# Patient Record
Sex: Female | Born: 1990 | Hispanic: No | Smoking: Never smoker
Health system: Southern US, Community
[De-identification: ages and names within clinical notes are randomized; demographics above are authoritative.]

## PROBLEM LIST (undated history)

## (undated) DIAGNOSIS — Z789 Other specified health status: Secondary | ICD-10-CM

## (undated) HISTORY — PX: NO PAST SURGERIES: SHX2092

## (undated) HISTORY — DX: Other specified health status: Z78.9

---

## 2020-05-02 NOTE — L&D Delivery Note (Signed)
OB/GYN Faculty Practice Delivery Note  Stacey Knight is a 30 y.o. G2P1001 s/p VAVD at [redacted]w[redacted]d. She was admitted for labor.   ROM: 1h 34m with clear fluid GBS Status:  Negative/-- (09/22 1528)  Labor Progress: Initial SVE: 6 cm. She then progressed to complete. She did not have an epidural.   Delivery Date/Time: 01/29/21 at 1633 Delivery: Called to room as the patient was complete and pushing and FHT were noted to be down. Suture position confirmed. Somewhat tight band noted at perineum but did not feel like it was preventing delivery of the head. Head was at outlet, so vacuum applied. Head delivered ROA. No nuchal cord absent. With delivery of the head, turtling noted. Informed support staff of likely shoulder dystocia. Patient placed in McRobert's position. No fundal pressure applied. Anterior (left) shoulder did not delivery with gentle downward traction. Suprapubic pressure applied and anterior shoulder delivered with gentle downward traction. Total shoulder dystocia time was less than 1 minute.   Body then delivered in usual fashion. Infant with spontaneous cry, placed on mother's abdomen, dried and stimulated. Cord clamped x 2 after 1-minute delay, and cut by Dr. Nobie Putnam. Cord blood drawn. Placenta delivered spontaneously with gentle cord traction and suprapubic pressure. Despite this as placenta delivered, uterus could be felt behind the placenta c/w uterine inversion. Pitocin was already off and the patient was given terbutaline SQ once and while this was given, I place one hand above and one fist below to reduce the uterine inversion. The total time for uterine inversion was approximately 1 minute. Pitocin restarted.   Fundus firm with massage and Pitocin. Labia, perineum, vagina, and cervix inspected  and repaired with 3-0 vicryl. Repair done in the usual fashion. After repair I checked the fundus and LUS once more on bimanual exam and the uterine was firm.  Baby Weight: 3949g  Placenta: Sent  to pathology Complications: Shoulder dystocia, Uterine inversion Lacerations: 2nd degree, right periurethral EBL: 150 mL Analgesia: Local for repair   Infant:  APGAR (1 MIN): 5   APGAR (5 MINS): 9    Following delivery and after recovery, I returned to the patient room and reviewed all aspects of delivery of baby and placenta. Discussed risk of recurrence for shoulder dystocia. Discussed likely related to size of the baby especially given how much larger than her first. She notes first baby was 3.2 kg and that was a challenging delivery. We discussed the uterine inversion. Discussed no long term sequelae from any of these complications given that baby was moving both arms normally without deficit.

## 2020-11-26 ENCOUNTER — Other Ambulatory Visit (HOSPITAL_COMMUNITY)
Admission: RE | Admit: 2020-11-26 | Discharge: 2020-11-26 | Disposition: A | Discharge status: Home / Self Care | Payer: Medicaid Other | Source: Ambulatory Visit | Attending: Obstetrics and Gynecology | Admitting: Obstetrics and Gynecology

## 2020-11-26 ENCOUNTER — Other Ambulatory Visit: Payer: Self-pay

## 2020-11-26 ENCOUNTER — Encounter: Payer: Self-pay | Admitting: Obstetrics and Gynecology

## 2020-11-26 ENCOUNTER — Ambulatory Visit (INDEPENDENT_AMBULATORY_CARE_PROVIDER_SITE_OTHER): Payer: Self-pay | Admitting: Obstetrics and Gynecology

## 2020-11-26 DIAGNOSIS — Z3480 Encounter for supervision of other normal pregnancy, unspecified trimester: Secondary | ICD-10-CM | POA: Insufficient documentation

## 2020-11-26 DIAGNOSIS — Z603 Acculturation difficulty: Secondary | ICD-10-CM

## 2020-11-26 DIAGNOSIS — Z789 Other specified health status: Secondary | ICD-10-CM

## 2020-11-26 DIAGNOSIS — Z3490 Encounter for supervision of normal pregnancy, unspecified, unspecified trimester: Secondary | ICD-10-CM | POA: Insufficient documentation

## 2020-11-26 HISTORY — DX: Acculturation difficulty: Z60.3

## 2020-11-26 NOTE — Patient Instructions (Signed)

## 2020-11-26 NOTE — Progress Notes (Signed)
  Subjective:    Stacey Knight is a G2P1001 [redacted]w[redacted]d being seen today for her first obstetrical visit.  Patient arrived from [place] with some care. Patient reports a normal anatomy ultrasound and denies concerns with hypertension. Her obstetrical history is significant for previous uncomplicated vaginal delivery and language barrier. Patient does intend to breast feed. Pregnancy history fully reviewed.  Patient reports no complaints.  Vitals:   11/26/20 1350 11/26/20 1356  BP: 108/83   Pulse: 100   Weight: 154 lb 8 oz (70.1 kg)   Height:  6\' 1"  (1.854 m)    HISTORY: OB History  Gravida Para Term Preterm AB Living  2 1 1     1   SAB IAB Ectopic Multiple Live Births          1    # Outcome Date GA Lbr Len/2nd Weight Sex Delivery Anes PTL Lv  2 Current           1 Term 01/05/18    M Vag-Spont None N LIV   History reviewed. No pertinent past medical history. History reviewed. No pertinent surgical history. History reviewed. No pertinent family history.   Exam    Uterus:     Pelvic Exam:    Perineum: Normal Perineum   Vulva: normal   Vagina:  normal mucosa, normal discharge   pH:    Cervix: multiparous appearance and cervix is closed and long   Adnexa: not evaluated   Bony Pelvis: gynecoid  System: Breast:  normal appearance, no masses or tenderness   Skin: normal coloration and turgor, no rashes    Neurologic: oriented, no focal deficits   Extremities: normal strength, tone, and muscle mass   HEENT extra ocular movement intact   Mouth/Teeth mucous membranes moist, pharynx normal without lesions and dental hygiene good   Neck supple and no masses   Cardiovascular: regular rate and rhythm   Respiratory:  appears well, vitals normal, no respiratory distress, acyanotic, normal RR, chest clear, no wheezing, crepitations, rhonchi, normal symmetric air entry   Abdomen: soft, non-tender; bowel sounds normal; no masses,  no organomegaly and gravid   Urinary:       Assessment:     Pregnancy: G2P1001 There are no problems to display for this patient.       Plan:     Initial labs drawn. Prenatal vitamins. Problem list reviewed and updated. Genetic Screening discussed : undecided.  Ultrasound discussed; fetal survey: ordered. Patient to return prior to her next appointment for glucola  Follow up in 2 weeks. 50% of 30 min visit spent on counseling and coordination of care.     Corryn Madewell 11/26/2020

## 2020-11-27 LAB — CBC/D/PLT+RPR+RH+ABO+RUBIGG...
Antibody Screen: NEGATIVE
Basophils Absolute: 0.1 10*3/uL (ref 0.0–0.2)
Basos: 1 %
EOS (ABSOLUTE): 0.4 10*3/uL (ref 0.0–0.4)
Eos: 4 %
HCV Ab: 0.1 s/co ratio (ref 0.0–0.9)
HIV Screen 4th Generation wRfx: NONREACTIVE
Hematocrit: 37.8 % (ref 34.0–46.6)
Hemoglobin: 12.7 g/dL (ref 11.1–15.9)
Hepatitis B Surface Ag: NEGATIVE
Immature Grans (Abs): 0 10*3/uL (ref 0.0–0.1)
Immature Granulocytes: 0 %
Lymphocytes Absolute: 1.8 10*3/uL (ref 0.7–3.1)
Lymphs: 22 %
MCH: 30.1 pg (ref 26.6–33.0)
MCHC: 33.6 g/dL (ref 31.5–35.7)
MCV: 90 fL (ref 79–97)
Monocytes Absolute: 0.6 10*3/uL (ref 0.1–0.9)
Monocytes: 7 %
Neutrophils Absolute: 5.4 10*3/uL (ref 1.4–7.0)
Neutrophils: 66 %
Platelets: 248 10*3/uL (ref 150–450)
RBC: 4.22 x10E6/uL (ref 3.77–5.28)
RDW: 12.2 % (ref 11.7–15.4)
RPR Ser Ql: NONREACTIVE
Rh Factor: POSITIVE
Rubella Antibodies, IGG: 2 index (ref >0.99)
WBC: 8.3 10*3/uL (ref 3.4–10.8)

## 2020-11-27 LAB — CERVICOVAGINAL ANCILLARY ONLY
Bacterial Vaginitis (gardnerella): POSITIVE — AB
Candida Glabrata: NEGATIVE
Candida Vaginitis: POSITIVE — AB
Chlamydia: NEGATIVE
Comment: NEGATIVE
Comment: NEGATIVE
Comment: NEGATIVE
Comment: NEGATIVE
Comment: NEGATIVE
Comment: NORMAL
Neisseria Gonorrhea: NEGATIVE
Trichomonas: NEGATIVE

## 2020-11-27 LAB — HCV INTERPRETATION

## 2020-11-27 LAB — HEMOGLOBIN A1C
Est. average glucose Bld gHb Est-mCnc: 100 mg/dL
Hgb A1c MFr Bld: 5.1 % (ref 4.8–5.6)

## 2020-11-30 MED ORDER — TERCONAZOLE 0.8 % VA CREA: 1.0000 | TOPICAL_CREAM | Freq: Every day | VAGINAL | 0 refills | Status: DC

## 2020-11-30 MED ORDER — METRONIDAZOLE 500 MG PO TABS: 500.0000 mg | ORAL_TABLET | Freq: Two times a day (BID) | ORAL | 0 refills | Status: DC

## 2020-11-30 NOTE — Addendum Note (Signed)
Addended by: Catalina Antigua on: 11/30/2020 09:54 AM   Modules accepted: Orders

## 2020-12-01 LAB — URINE CULTURE, OB REFLEX

## 2020-12-01 LAB — CULTURE, OB URINE

## 2020-12-02 ENCOUNTER — Telehealth: Payer: Self-pay

## 2020-12-02 MED ORDER — TERCONAZOLE 0.8 % VA CREA: 1.0000 | TOPICAL_CREAM | Freq: Every day | VAGINAL | 0 refills | Status: DC

## 2020-12-02 MED ORDER — METRONIDAZOLE 500 MG PO TABS: 500.0000 mg | ORAL_TABLET | Freq: Two times a day (BID) | ORAL | 0 refills | Status: DC

## 2020-12-02 NOTE — Telephone Encounter (Signed)
-----   Message from Catalina Antigua, MD sent at 11/30/2020  9:53 AM EDT ----- PLease inform patient of yeast and BV infection. Rx are in epic and need to be called into the patients' preferred pharmacy

## 2020-12-02 NOTE — Telephone Encounter (Signed)
[phone] Pt did no answer and pt does not have VM set up. No Mychart set up. Will try pt another time before sending pt letter.   Judeth Cornfield, RN

## 2020-12-02 NOTE — Telephone Encounter (Signed)
Spoke with pt.pt given results and recommendations per Dr Jolayne Panther. Pt verbalized understanding and agreeable to plan of care.  Rx Flagyl and Terazol cream sent to pharmacy on file. Pt aware.   Judeth Cornfield, RN

## 2020-12-11 ENCOUNTER — Encounter: Payer: Self-pay | Admitting: Family Medicine

## 2020-12-11 ENCOUNTER — Ambulatory Visit (INDEPENDENT_AMBULATORY_CARE_PROVIDER_SITE_OTHER): Payer: Self-pay | Admitting: Family Medicine

## 2020-12-11 ENCOUNTER — Other Ambulatory Visit: Payer: Self-pay

## 2020-12-11 VITALS — BP 108/74 | HR 85 | Wt 155.9 lb

## 2020-12-11 DIAGNOSIS — Z3480 Encounter for supervision of other normal pregnancy, unspecified trimester: Secondary | ICD-10-CM

## 2020-12-11 DIAGNOSIS — Z789 Other specified health status: Secondary | ICD-10-CM

## 2020-12-11 NOTE — Patient Instructions (Signed)

## 2020-12-11 NOTE — Progress Notes (Signed)
   Subjective:  Stacey Knight is a 30 y.o. G2P1001 at [redacted]w[redacted]d being seen today for ongoing prenatal care.  She is currently monitored for the following issues for this low-risk pregnancy and has Supervision of other normal pregnancy, antepartum and Language barrier affecting health care on their problem list.  Patient reports no complaints.  Contractions: Not present. Vag. Bleeding: None.  Movement: Present. Denies leaking of fluid.   The following portions of the patient's history were reviewed and updated as appropriate: allergies, current medications, past family history, past medical history, past social history, past surgical history and problem list. Problem list updated.  Objective:   Vitals:   12/11/20 1030  BP: 108/74  Pulse: 85  Weight: 155 lb 14.4 oz (70.7 kg)    Fetal Status: Fetal Heart Rate (bpm): 136 Fundal Height: 32 cm Movement: Present     General:  Alert, oriented and cooperative. Patient is in no acute distress.  Skin: Skin is warm and dry. No rash noted.   Cardiovascular: Normal heart rate noted  Respiratory: Normal respiratory effort, no problems with respiration noted  Abdomen: Soft, gravid, appropriate for gestational age. Pain/Pressure: Absent     Pelvic: Vag. Bleeding: None     Cervical exam deferred        Extremities: Normal range of motion.  Edema: None  Mental Status: Normal mood and affect. Normal behavior. Normal judgment and thought content.   Urinalysis:      Assessment and Plan:  Pregnancy: G2P1001 at [redacted]w[redacted]d  1. Supervision of other normal pregnancy, antepartum BP and FHR normal FH appropriate Scheduled for anatomy scan later this month Prenatal records from [place] show she received TDaP on 10/12/20 Needs 2hr GTT ASAP, initial A1c was normal, will schedule at checkout  2. Language barrier affecting health care Swahili but speaks good English  Preterm labor symptoms and general obstetric precautions including but not limited to vaginal bleeding,  contractions, leaking of fluid and fetal movement were reviewed in detail with the patient. Please refer to After Visit Summary for other counseling recommendations.  Return in 2 weeks (on 12/25/2020) for [facility], ob visit.   Venora Maples, MD

## 2020-12-21 ENCOUNTER — Other Ambulatory Visit: Payer: Self-pay

## 2020-12-21 ENCOUNTER — Ambulatory Visit: Payer: Self-pay

## 2020-12-23 ENCOUNTER — Other Ambulatory Visit: Payer: Self-pay

## 2020-12-23 ENCOUNTER — Encounter: Payer: Self-pay | Admitting: Obstetrics and Gynecology

## 2020-12-23 ENCOUNTER — Ambulatory Visit (INDEPENDENT_AMBULATORY_CARE_PROVIDER_SITE_OTHER): Payer: Self-pay | Admitting: Obstetrics and Gynecology

## 2020-12-23 ENCOUNTER — Ambulatory Visit: Payer: Medicaid Other | Attending: Obstetrics and Gynecology

## 2020-12-23 ENCOUNTER — Other Ambulatory Visit (HOSPITAL_COMMUNITY): Payer: Self-pay | Admitting: Obstetrics

## 2020-12-23 ENCOUNTER — Ambulatory Visit: Payer: Medicaid Other | Admitting: *Deleted

## 2020-12-23 ENCOUNTER — Encounter: Payer: Self-pay | Admitting: *Deleted

## 2020-12-23 VITALS — BP 93/67 | HR 87 | Wt 153.5 lb

## 2020-12-23 VITALS — BP 100/62 | HR 78

## 2020-12-23 DIAGNOSIS — Z789 Other specified health status: Secondary | ICD-10-CM

## 2020-12-23 DIAGNOSIS — Z3480 Encounter for supervision of other normal pregnancy, unspecified trimester: Secondary | ICD-10-CM

## 2020-12-23 DIAGNOSIS — O0933 Supervision of pregnancy with insufficient antenatal care, third trimester: Secondary | ICD-10-CM

## 2020-12-23 NOTE — Progress Notes (Signed)
Patient declined interpreter. Stated that she "can speak [place]"

## 2020-12-23 NOTE — Addendum Note (Signed)
Addended by: Milas Hock A on: 12/23/2020 11:13 AM   Modules accepted: Orders

## 2020-12-23 NOTE — Progress Notes (Signed)
   PRENATAL VISIT NOTE  Subjective:  Stacey Knight is a 30 y.o. G2P1001 at [redacted]w[redacted]d being seen today for ongoing prenatal care.  She is currently monitored for the following issues for this low-risk pregnancy and has Supervision of other normal pregnancy, antepartum and Language barrier affecting health care on their problem list.  Patient reports no complaints.  Contractions: Not present.  .  Movement: Present. Denies leaking of fluid.   The following portions of the patient's history were reviewed and updated as appropriate: allergies, current medications, past family history, past medical history, past social history, past surgical history and problem list.   Objective:   Vitals:   12/23/20 1013  BP: 93/67  Pulse: 87  Weight: 153 lb 8 oz (69.6 kg)    Fetal Status: Fetal Heart Rate (bpm): 138   Movement: Present     General:  Alert, oriented and cooperative. Patient is in no acute distress.  Skin: Skin is warm and dry. No rash noted.   Cardiovascular: Normal heart rate noted  Respiratory: Normal respiratory effort, no problems with respiration noted  Abdomen: Soft, gravid, appropriate for gestational age.  Pain/Pressure: Absent     Pelvic: Cervical exam deferred        Extremities: Normal range of motion.  Edema: None  Mental Status: Normal mood and affect. Normal behavior. Normal judgment and thought content.   Assessment and Plan:  Pregnancy: G2P1001 at [redacted]w[redacted]d 1. Supervision of other normal pregnancy, antepartum - She is due for 2 hr GTT. She has not eaten anything today. Just had HIV/RPR one month ago and negative - does not need to repeat. We will try to arrange for GTT today since she does not have [place] until this afternoon and does not plan to go anywhere until this is completed.  - GBS/Cx next time - Plans to breastfeed - Declines birth control - spouse is not in the [place]. Reviewed I would still provide info in case she wants LARC - 2Hr GTT w/ 1 Hr Carpenter 75 g  2. Language  barrier affecting health care - She speaks and understands [place]. Interpreter offered but she prefers not to have one and declined.   Preterm labor symptoms and general obstetric precautions including but not limited to vaginal bleeding, contractions, leaking of fluid and fetal movement were reviewed in detail with the patient. Please refer to After Visit Summary for other counseling recommendations.   Return in about 2 weeks (around 01/06/2021) for OB VISIT, MD or APP.  Future Appointments  Date Time Provider Department Center  12/23/2020  2:30 PM [facility] NURSE [facility]  12/23/2020  2:45 PM [facility]    Milas Hock, MD

## 2020-12-24 LAB — GLUCOSE TOLERANCE, 2 HOURS W/ 1HR
Glucose, 1 hour: 71 mg/dL (ref 65–179)
Glucose, 2 hour: 67 mg/dL (ref 65–152)
Glucose, Fasting: 74 mg/dL (ref 65–91)

## 2021-01-11 ENCOUNTER — Encounter: Payer: Self-pay | Admitting: Obstetrics and Gynecology

## 2021-01-12 NOTE — Progress Notes (Signed)
Patient did not keep her OB appointment for 01/11/2021.  Cornelia Copa MD Attending Center for [employer] [facility])

## 2021-01-13 ENCOUNTER — Other Ambulatory Visit: Payer: Self-pay

## 2021-01-13 ENCOUNTER — Ambulatory Visit: Payer: Medicaid Other | Attending: Obstetrics

## 2021-01-13 ENCOUNTER — Ambulatory Visit: Payer: Medicaid Other | Admitting: *Deleted

## 2021-01-13 ENCOUNTER — Encounter: Payer: Self-pay | Admitting: *Deleted

## 2021-01-13 VITALS — BP 110/72 | HR 81

## 2021-01-13 DIAGNOSIS — Z789 Other specified health status: Secondary | ICD-10-CM | POA: Diagnosis present

## 2021-01-13 DIAGNOSIS — Z3480 Encounter for supervision of other normal pregnancy, unspecified trimester: Secondary | ICD-10-CM | POA: Diagnosis present

## 2021-01-13 DIAGNOSIS — Z3A37 37 weeks gestation of pregnancy: Secondary | ICD-10-CM

## 2021-01-13 DIAGNOSIS — Z362 Encounter for other antenatal screening follow-up: Secondary | ICD-10-CM

## 2021-01-13 DIAGNOSIS — O0933 Supervision of pregnancy with insufficient antenatal care, third trimester: Secondary | ICD-10-CM | POA: Diagnosis not present

## 2021-01-21 ENCOUNTER — Ambulatory Visit (INDEPENDENT_AMBULATORY_CARE_PROVIDER_SITE_OTHER): Payer: Self-pay | Admitting: Obstetrics and Gynecology

## 2021-01-21 ENCOUNTER — Encounter: Payer: Self-pay | Admitting: Obstetrics and Gynecology

## 2021-01-21 ENCOUNTER — Other Ambulatory Visit (HOSPITAL_COMMUNITY)
Admission: RE | Admit: 2021-01-21 | Discharge: 2021-01-21 | Disposition: A | Discharge status: Home / Self Care | Payer: Medicaid Other | Source: Ambulatory Visit | Attending: Obstetrics and Gynecology | Admitting: Obstetrics and Gynecology

## 2021-01-21 ENCOUNTER — Other Ambulatory Visit: Payer: Self-pay

## 2021-01-21 VITALS — BP 102/78 | HR 93 | Wt 160.2 lb

## 2021-01-21 DIAGNOSIS — Z789 Other specified health status: Secondary | ICD-10-CM

## 2021-01-21 DIAGNOSIS — Z3480 Encounter for supervision of other normal pregnancy, unspecified trimester: Secondary | ICD-10-CM | POA: Insufficient documentation

## 2021-01-21 LAB — OB RESULTS CONSOLE GC/CHLAMYDIA: Gonorrhea: NEGATIVE

## 2021-01-21 NOTE — Patient Instructions (Signed)
Vaginal Delivery ?Vaginal delivery means that you give birth by pushing your baby out of your birth canal (vagina). Your health care team will help you before, during, and after vaginal delivery. ?Birth experiences are unique for every woman and every pregnancy, and birth experiences vary depending on where you choose to give birth. ?What are the risks and benefits? ?Generally, this is safe. However, problems may occur, including: ?Bleeding. ?Infection. ?Damage to other structures such as vaginal tearing. ?Allergic reactions to medicines. ?Despite the risks, benefits of vaginal delivery include less risk of bleeding and infection and a shorter recovery time compared to a Cesarean delivery. Cesarean delivery, or C-section, is the surgical delivery of a baby. ?What happens when I arrive at the birth center or hospital? ?Once you are in labor and have been admitted into the hospital or birth center, your health care team may: ?Review your pregnancy history and any concerns that you have. ?Talk with you about your birth plan and discuss pain control options. ?Check your blood pressure, breathing, and heartbeat. ?Assess your baby's heartbeat. ?Monitor your uterus for contractions. ?Check whether your bag of water (amniotic sac) has broken (ruptured). ?Insert an IV into one of your veins. This may be used to give you fluids and medicines. ?Monitoring ?Your health care team may assess your contractions (uterine monitoring) and your baby's heart rate (fetal monitoring). You may need to be monitored: ?Often, but not continuously (intermittently). ?All the time or for long periods at a time (continuously). Continuous monitoring may be needed if: ?You are taking certain medicines, such as medicine to relieve pain or make your contractions stronger. ?You have pregnancy or labor complications. ?Monitoring may be done by: ?Placing a special stethoscope or a handheld monitoring device on your abdomen to check your baby's heartbeat  and to check for contractions. ?Placing monitors on your abdomen (external monitors) to record your baby's heartbeat and the frequency and length of contractions. ?Placing monitors inside your uterus through your vagina (internal monitors) to record your baby's heartbeat and the frequency, length, and strength of your contractions. Depending on the type of monitor, it may remain in your uterus or on your baby's head until birth. ?Telemetry. This is a type of continuous monitoring that can be done with external or internal monitors. Instead of having to stay in bed, you are able to move around. ?Physical exam ?Your health care team may perform frequent physical exams. This may include: ?Checking how and where your baby is positioned in your uterus. ?Checking your cervix to determine: ?Whether it is thinning out (effacing). ?Whether it is opening up (dilating). ?What happens during labor and delivery? ?Normal labor and delivery is divided into the following three stages: ?Stage 1 ?This is the longest stage of labor. ?Throughout this stage, you will feel contractions. Contractions generally feel mild, infrequent, and irregular at first. They get stronger, more frequent, and more regular as you move through this stage. You may have contractions about every 2-3 minutes. ?This stage ends when your cervix is completely dilated to 4 inches (10 cm) and completely effaced. ?Stage 2 ?This stage starts once your cervix is completely effaced and dilated and lasts until the delivery of your baby. ?This is the stage where you will feel an urge to push your baby out of your vagina. ?You may feel stretching and burning pain, especially when the widest part of your baby's head passes through the vaginal opening (crowning). ?Once your baby is delivered, the umbilical cord will be clamped and   cut. Timing of cutting the cord will depend on your wishes, your baby's health, and your health care provider's practices. ?Your baby will be  placed on your bare chest (skin-to-skin contact) in an upright position and covered with a warm blanket. If you are choosing to breastfeed, watch your baby for feeding cues, like rooting or sucking, and help the baby to your breast for his or her first feeding. ?Stage 3 ?This stage starts immediately after the birth of your baby and ends after you deliver the placenta. ?This stage may take anywhere from 5 to 30 minutes. ?After your baby has been delivered, you will feel contractions as your body expels the placenta. These contractions also help your uterus get smaller and reduce bleeding. ?What can I expect after labor and delivery? ?After labor is over, you and your baby will be assessed closely until you are ready to go home. Your health care team will teach you how to care for yourself and your baby. ?You and your baby may be encouraged to stay in the same room (rooming in) during your hospital stay. This will help promote early bonding and successful breastfeeding. ?Your uterus will be checked and massaged regularly (fundal massage). ?You may continue to receive fluids and medicines through an IV. ?You will have some soreness and pain in your abdomen, vagina, and the area of skin between your vaginal opening and your anus (perineum). ?If an incision was made near your vagina (episiotomy) or if you had some vaginal tearing during delivery, cold compresses may be placed on your episiotomy or your tear. This helps to reduce pain and swelling. ?It is normal to have vaginal bleeding after delivery. Wear a sanitary pad for vaginal bleeding and discharge. ?Summary ?Vaginal delivery means that you will give birth by pushing your baby out of your birth canal (vagina). ?Your health care team will monitor you and your baby throughout the stages of labor. ?After you deliver your baby, your health care team will continue to assess you and your baby to ensure you are both recovering as expected after delivery. ?This  information is not intended to replace advice given to you by your health care provider. Make sure you discuss any questions you have with your health care provider. ?Document Revised: 03/16/2020 Document Reviewed: 03/16/2020 ?Elsevier Patient Education ? 2022 Elsevier Inc. ? ?

## 2021-01-21 NOTE — Progress Notes (Signed)
Subjective:  Stacey Knight is a 30 y.o. G2P1001 at [redacted]w[redacted]d being seen today for ongoing prenatal care.  She is currently monitored for the following issues for this low-risk pregnancy and has Supervision of other normal pregnancy, antepartum and Language barrier affecting health care on their problem list.  Patient reports no complaints.  Contractions: Not present. Vag. Bleeding: None.  Movement: Present. Denies leaking of fluid.   The following portions of the patient's history were reviewed and updated as appropriate: allergies, current medications, past family history, past medical history, past social history, past surgical history and problem list. Problem list updated.  Objective:   Vitals:   01/21/21 1441  BP: 102/78  Pulse: 93  Weight: 160 lb 3.2 oz (72.7 kg)    Fetal Status: Fetal Heart Rate (bpm): 140   Movement: Present     General:  Alert, oriented and cooperative. Patient is in no acute distress.  Skin: Skin is warm and dry. No rash noted.   Cardiovascular: Normal heart rate noted  Respiratory: Normal respiratory effort, no problems with respiration noted  Abdomen: Soft, gravid, appropriate for gestational age. Pain/Pressure: Absent     Pelvic:  Cervical exam performed        Extremities: Normal range of motion.  Edema: None  Mental Status: Normal mood and affect. Normal behavior. Normal judgment and thought content.   Urinalysis:      Assessment and Plan:  Pregnancy: G2P1001 at [redacted]w[redacted]d  1. Supervision of other normal pregnancy, antepartum Labor precautions - GC/Chlamydia probe amp ([place])not at [facility] - Culture, beta strep (group b only)  2. Language barrier affecting health care Live interrupter used during today's visit  Term labor symptoms and general obstetric precautions including but not limited to vaginal bleeding, contractions, leaking of fluid and fetal movement were reviewed in detail with the patient. Please refer to After Visit Summary for other  counseling recommendations.  Return in about 1 week (around 01/28/2021) for OB visit, face to face, any provider.   Hermina Staggers, MD

## 2021-01-22 LAB — GC/CHLAMYDIA PROBE AMP (~~LOC~~) NOT AT ARMC
Chlamydia: NEGATIVE
Comment: NEGATIVE
Comment: NORMAL
Neisseria Gonorrhea: NEGATIVE

## 2021-01-24 LAB — CULTURE, BETA STREP (GROUP B ONLY): Strep Gp B Culture: NEGATIVE

## 2021-01-28 ENCOUNTER — Other Ambulatory Visit: Payer: Self-pay

## 2021-01-28 ENCOUNTER — Ambulatory Visit (INDEPENDENT_AMBULATORY_CARE_PROVIDER_SITE_OTHER): Payer: Self-pay | Admitting: Obstetrics and Gynecology

## 2021-01-28 VITALS — BP 112/77 | HR 91 | Wt 163.0 lb

## 2021-01-28 DIAGNOSIS — Z789 Other specified health status: Secondary | ICD-10-CM

## 2021-01-28 DIAGNOSIS — Z3480 Encounter for supervision of other normal pregnancy, unspecified trimester: Secondary | ICD-10-CM

## 2021-01-28 NOTE — Patient Instructions (Signed)
Come to the hospital at 1145 at night on Saturday

## 2021-01-29 ENCOUNTER — Encounter (HOSPITAL_COMMUNITY): Payer: Self-pay | Admitting: Obstetrics & Gynecology

## 2021-01-29 ENCOUNTER — Inpatient Hospital Stay (HOSPITAL_COMMUNITY)
Admission: AD | Admit: 2021-01-29 | Discharge: 2021-01-31 | DRG: 806 | Disposition: A | Discharge status: Home / Self Care | Payer: Medicaid Other | Attending: Obstetrics and Gynecology | Admitting: Obstetrics and Gynecology

## 2021-01-29 ENCOUNTER — Other Ambulatory Visit: Payer: Self-pay

## 2021-01-29 DIAGNOSIS — Z603 Acculturation difficulty: Secondary | ICD-10-CM | POA: Diagnosis present

## 2021-01-29 DIAGNOSIS — Z3A39 39 weeks gestation of pregnancy: Secondary | ICD-10-CM

## 2021-01-29 DIAGNOSIS — Z3490 Encounter for supervision of normal pregnancy, unspecified, unspecified trimester: Secondary | ICD-10-CM

## 2021-01-29 DIAGNOSIS — Z3480 Encounter for supervision of other normal pregnancy, unspecified trimester: Secondary | ICD-10-CM

## 2021-01-29 DIAGNOSIS — Z789 Other specified health status: Secondary | ICD-10-CM

## 2021-01-29 DIAGNOSIS — Z758 Other problems related to medical facilities and other health care: Secondary | ICD-10-CM

## 2021-01-29 DIAGNOSIS — Z20822 Contact with and (suspected) exposure to covid-19: Secondary | ICD-10-CM | POA: Diagnosis present

## 2021-01-29 DIAGNOSIS — Z8742 Personal history of other diseases of the female genital tract: Secondary | ICD-10-CM | POA: Diagnosis not present

## 2021-01-29 DIAGNOSIS — N855 Inversion of uterus: Secondary | ICD-10-CM | POA: Diagnosis not present

## 2021-01-29 DIAGNOSIS — O26893 Other specified pregnancy related conditions, third trimester: Secondary | ICD-10-CM | POA: Diagnosis present

## 2021-01-29 LAB — TYPE AND SCREEN
ABO/RH(D): B POS
Antibody Screen: NEGATIVE

## 2021-01-29 LAB — RESP PANEL BY RT-PCR (FLU A&B, COVID) ARPGX2
Influenza A by PCR: NEGATIVE
Influenza B by PCR: NEGATIVE
SARS Coronavirus 2 by RT PCR: NEGATIVE

## 2021-01-29 LAB — CBC
HCT: 37.5 % (ref 36.0–46.0)
Hemoglobin: 12.8 g/dL (ref 12.0–15.0)
MCH: 29.2 pg (ref 26.0–34.0)
MCHC: 34.1 g/dL (ref 30.0–36.0)
MCV: 85.4 fL (ref 80.0–100.0)
Platelets: 236 10*3/uL (ref 150–400)
RBC: 4.39 MIL/uL (ref 3.87–5.11)
RDW: 13.8 % (ref 11.5–15.5)
WBC: 9.4 10*3/uL (ref 4.0–10.5)
nRBC: 0 % (ref 0.0–0.2)

## 2021-01-29 MED ORDER — MEASLES, MUMPS & RUBELLA VAC IJ SOLR: 0.5000 mL | Freq: Once | INTRAMUSCULAR | Status: DC

## 2021-01-29 MED ORDER — IBUPROFEN 600 MG PO TABS
600.0000 mg | ORAL_TABLET | Freq: Four times a day (QID) | ORAL | Status: DC
  Administered 2021-01-30 – 2021-01-31 (×6): 600 mg via ORAL
  Filled 2021-01-29 (×6): qty 1

## 2021-01-29 MED ORDER — WITCH HAZEL-GLYCERIN EX PADS: 1.0000 "application " | MEDICATED_PAD | CUTANEOUS | Status: DC | PRN

## 2021-01-29 MED ORDER — PRENATAL MULTIVITAMIN CH
1.0000 | ORAL_TABLET | Freq: Every day | ORAL | Status: DC
  Administered 2021-01-30: 1 via ORAL
  Filled 2021-01-29: qty 1

## 2021-01-29 MED ORDER — ACETAMINOPHEN 325 MG PO TABS
650.0000 mg | ORAL_TABLET | ORAL | Status: DC | PRN
  Administered 2021-01-29: 650 mg via ORAL
  Filled 2021-01-29: qty 2

## 2021-01-29 MED ORDER — LACTATED RINGERS IV SOLN: 500.0000 mL | INTRAVENOUS | Status: DC | PRN

## 2021-01-29 MED ORDER — DIBUCAINE (PERIANAL) 1 % EX OINT: 1.0000 "application " | TOPICAL_OINTMENT | CUTANEOUS | Status: DC | PRN

## 2021-01-29 MED ORDER — SIMETHICONE 80 MG PO CHEW: 80.0000 mg | CHEWABLE_TABLET | ORAL | Status: DC | PRN

## 2021-01-29 MED ORDER — SOD CITRATE-CITRIC ACID 500-334 MG/5ML PO SOLN: 30.0000 mL | ORAL | Status: DC | PRN

## 2021-01-29 MED ORDER — OXYTOCIN-SODIUM CHLORIDE 30-0.9 UT/500ML-% IV SOLN
2.5000 [IU]/h | INTRAVENOUS | Status: DC
  Filled 2021-01-29: qty 500

## 2021-01-29 MED ORDER — LACTATED RINGERS IV SOLN: INTRAVENOUS | Status: DC

## 2021-01-29 MED ORDER — ONDANSETRON HCL 4 MG/2ML IJ SOLN: 4.0000 mg | Freq: Four times a day (QID) | INTRAMUSCULAR | Status: DC | PRN

## 2021-01-29 MED ORDER — ZOLPIDEM TARTRATE 5 MG PO TABS: 5.0000 mg | ORAL_TABLET | Freq: Every evening | ORAL | Status: DC | PRN

## 2021-01-29 MED ORDER — ONDANSETRON HCL 4 MG PO TABS: 4.0000 mg | ORAL_TABLET | ORAL | Status: DC | PRN

## 2021-01-29 MED ORDER — MISOPROSTOL 25 MCG QUARTER TABLET: 25.0000 ug | ORAL_TABLET | ORAL | Status: DC | PRN

## 2021-01-29 MED ORDER — OXYTOCIN BOLUS FROM INFUSION
333.0000 mL | Freq: Once | INTRAVENOUS | Status: AC
  Administered 2021-01-29: 333 mL via INTRAVENOUS

## 2021-01-29 MED ORDER — SODIUM CHLORIDE 0.9 % IV SOLN: 250.0000 mL | INTRAVENOUS | Status: DC | PRN

## 2021-01-29 MED ORDER — TERBUTALINE SULFATE 1 MG/ML IJ SOLN
0.2500 mg | Freq: Once | INTRAMUSCULAR | Status: AC | PRN
  Administered 2021-01-29: 0.25 mg via SUBCUTANEOUS
  Filled 2021-01-29: qty 1

## 2021-01-29 MED ORDER — DIPHENHYDRAMINE HCL 25 MG PO CAPS: 25.0000 mg | ORAL_CAPSULE | Freq: Four times a day (QID) | ORAL | Status: DC | PRN

## 2021-01-29 MED ORDER — SODIUM CHLORIDE 0.9% FLUSH: 3.0000 mL | INTRAVENOUS | Status: DC | PRN

## 2021-01-29 MED ORDER — BENZOCAINE-MENTHOL 20-0.5 % EX AERO
1.0000 "application " | INHALATION_SPRAY | CUTANEOUS | Status: DC | PRN
  Filled 2021-01-29: qty 56

## 2021-01-29 MED ORDER — TETANUS-DIPHTH-ACELL PERTUSSIS 5-2.5-18.5 LF-MCG/0.5 IM SUSY: 0.5000 mL | PREFILLED_SYRINGE | Freq: Once | INTRAMUSCULAR | Status: DC

## 2021-01-29 MED ORDER — FENTANYL CITRATE (PF) 100 MCG/2ML IJ SOLN
50.0000 ug | INTRAMUSCULAR | Status: DC | PRN
  Administered 2021-01-29: 100 ug via INTRAVENOUS
  Filled 2021-01-29: qty 2

## 2021-01-29 MED ORDER — LIDOCAINE HCL (PF) 1 % IJ SOLN
30.0000 mL | INTRAMUSCULAR | Status: AC | PRN
  Administered 2021-01-29: 30 mL via SUBCUTANEOUS
  Filled 2021-01-29: qty 30

## 2021-01-29 MED ORDER — SODIUM CHLORIDE 0.9% FLUSH: 3.0000 mL | Freq: Two times a day (BID) | INTRAVENOUS | Status: DC

## 2021-01-29 MED ORDER — ONDANSETRON HCL 4 MG/2ML IJ SOLN: 4.0000 mg | INTRAMUSCULAR | Status: DC | PRN

## 2021-01-29 MED ORDER — SENNOSIDES-DOCUSATE SODIUM 8.6-50 MG PO TABS
2.0000 | ORAL_TABLET | ORAL | Status: DC
  Administered 2021-01-30: 2 via ORAL
  Filled 2021-01-29: qty 2

## 2021-01-29 MED ORDER — COCONUT OIL OIL: 1.0000 "application " | TOPICAL_OIL | Status: DC | PRN

## 2021-01-29 MED ORDER — ACETAMINOPHEN 325 MG PO TABS: 650.0000 mg | ORAL_TABLET | ORAL | Status: DC | PRN

## 2021-01-29 NOTE — Progress Notes (Signed)
LABOR PROGRESS NOTE  Stacey Knight is a 30 y.o. G2P1001 at [redacted]w[redacted]d  admitted for SOL.  Subjective: Extremely uncomfortable during contractions   Objective: BP 111/83   Pulse 87   Temp 97.9 F (36.6 C) (Oral)   Resp 16   Ht 6\' 1"  (1.854 m)   Wt 73.9 kg   LMP 04/26/2020 (Exact Date)   SpO2 100%   BMI 21.51 kg/m  or  Vitals:   01/29/21 1257 01/29/21 1327 01/29/21 1330 01/29/21 1449  BP: 121/79 119/86 123/83 111/83  Pulse: 72 89 90 87  Resp: 16     Temp: 97.9 F (36.6 C)     TempSrc: Oral     SpO2:      Weight: 73.9 kg     Height: 6\' 1"  (1.854 m)       Dilation: Lip/rim Effacement (%): 90 Station: -1, 0 Presentation: Vertex Exam by:: RN FHT: baseline rate 145, moderate varibility, + acel, no decel Toco: 1-2 min  Labs: Lab Results  Component Value Date   WBC 9.4 01/29/2021   HGB 12.8 01/29/2021   HCT 37.5 01/29/2021   MCV 85.4 01/29/2021   PLT 236 01/29/2021    Patient Active Problem List   Diagnosis Date Noted   Supervision of normal pregnancy 01/29/2021   Supervision of other normal pregnancy, antepartum 11/26/2020   Language barrier affecting health care 11/26/2020    Assessment / Plan: 30 y.o. G2P1001 at [redacted]w[redacted]d here for SOL.   Labor: Progressing well, AROM at this exam. Continue expectant management. Fetal Wellbeing:  Cat 1, reassuring  Pain Control:  None  Anticipated MOD:  Vaginal   38, MD  PGY-3, [facility]  01/29/2021, 4:07 PM

## 2021-01-29 NOTE — H&P (Signed)
OBSTETRIC ADMISSION HISTORY AND PHYSICAL  Stacey Knight is a 30 y.o. female G2P1001 with IUP at [redacted]w[redacted]d by LMP presenting for SOL. She reports +FMs, No LOF, no VB, no blurry vision, headaches or peripheral edema, and RUQ pain.  She plans on breastfeeding. She declines birth control at this time, [place] the [place]. She received her prenatal care at [facility]   Dating: By LMP --->  Estimated Date of Delivery: 01/31/21  Sono:    @[redacted]w[redacted]d , CWD, normal anatomy, cephalic presentation, anterior lie, 3558g, 87% EFW   Prenatal History/Complications: None  Past Medical History: Past Medical History:  Diagnosis Date   Medical history non-contributory     Past Surgical History: Past Surgical History:  Procedure Laterality Date   NO PAST SURGERIES      Obstetrical History: OB History     Gravida  2   Para  1   Term  1   Preterm      AB      Living  1      SAB      IAB      Ectopic      Multiple      Live Births  1           Social History Social History   Socioeconomic History   Marital status: Unknown    Spouse name: Not on file   Number of children: Not on file   Years of education: Not on file   Highest education level: Not on file  Occupational History   Not on file  Tobacco Use   Smoking status: Never   Smokeless tobacco: Never  Vaping Use   Vaping Use: Never used  Substance and Sexual Activity   Alcohol use: Never   Drug use: Never   Sexual activity: Yes    Birth control/protection: None  Other Topics Concern   Not on file  Social History Narrative   Not on file   Social Determinants of Health   Financial Resource Strain: Not on file  Food Insecurity: No Food Insecurity   Worried About in the Last Year: Never true   Ran Out of Food in the Last Year: Never true  Transportation Needs: Unmet Transportation Needs   Lack of Transportation (Medical): Yes   Lack of Transportation (Non-Medical): Yes  Physical  Activity: Not on file  Stress: Not on file  Social Connections: Not on file    Family History: Family History  Problem Relation Age of Onset   Diabetes Neg Hx    Hypertension Neg Hx     Allergies: No Known Allergies  No medications prior to admission.     Review of Systems   All systems reviewed and negative except as stated in HPI  Blood pressure 118/76, pulse 82, temperature 97.8 F (36.6 C), temperature source Oral, resp. rate 15, last menstrual period 04/26/2020, SpO2 100 %. General appearance: alert, cooperative, and no distress Lungs: normal work of breathing  Heart: regular rate  Abdomen: soft, non-tender; bowel sounds normal Extremities: Homans sign is negative, no sign of DVT Presentation: cephalic Fetal monitoringBaseline: 140 bpm, Variability: Good {> 6 bpm), Accelerations: Reactive, and Decelerations: Absent Uterine activityFrequency: Every 1-3  minutes Dilation: 6 Effacement (%): 90 Station: -2 Exam by:: [email or web address], RN   Prenatal labs: ABO, Rh: B/Positive/-- (07/28 1438) Antibody: Negative (07/28 1438) Rubella: 2.00 (07/28 1438) RPR: Non Reactive (07/28 1438)  HBsAg: Negative (07/28 1438)  HIV: Non Reactive (07/28  1438)  GBS: Negative/-- (09/22 1528)  3 hr Glucola normal Genetic screening  none Anatomy US normal  Prenatal Transfer Tool  Maternal Diabetes: No Genetic Screening: Declined Maternal Ultrasounds/Referrals: Normal Fetal Ultrasounds or other Referrals:  None Maternal Substance Abuse:  No Significant Maternal Medications:  None Significant Maternal Lab Results: Group B Strep negative  No results found for this or any previous visit (from the past 24 hour(s)).  Patient Active Problem List   Diagnosis Date Noted   Supervision of normal pregnancy 01/29/2021   Supervision of other normal pregnancy, antepartum 11/26/2020   Language barrier affecting health care 11/26/2020    Assessment/Plan:  Stacey Knight is a 30 y.o. G2P1001 at  [redacted]w[redacted]d here for spontaneous labor, contractions started approx 11am  #Labor: Doing well at this time. Cervical exam 6/90/-2. Consider AROM.  #Pain: Unmedicated #FWB: Category 1, reassuring #ID: GBS negative #MOF: Breast #MOC: Declines #Circ: NA, girl   Stefani Dama, Student-PA  01/29/2021, 12:27 PM

## 2021-01-29 NOTE — Lactation Note (Signed)
This note was copied from a baby's chart. Lactation Consultation Note  Patient Name: Stacey Knight [id number] Date: 01/29/2021 Reason for consult: L&D Initial assessment;Mother's request;Term Age:31 hours  Mom in midst of a repair earlier and only able to assist with latching at this time. Mom experienced with breastfeeding. Infant latched and signs of transfer noted. Mom to receive further LC support on the floor.   Maternal Data Does the patient have breastfeeding experience prior to this delivery?: Yes How long did the patient breastfeed?: 16 months  Feeding Mother's Current Feeding Choice: Breast Milk  LATCH Score Latch: Repeated attempts needed to sustain latch, nipple held in mouth throughout feeding, stimulation needed to elicit sucking reflex.  Audible Swallowing: Spontaneous and intermittent  Type of Nipple: Everted at rest and after stimulation  Comfort (Breast/Nipple): Soft / non-tender  Hold (Positioning): Assistance needed to correctly position infant at breast and maintain latch.  LATCH Score: 8   Lactation Tools Discussed/Used    Interventions Interventions: Breast feeding basics reviewed;Assisted with latch;Skin to skin;Breast compression;Adjust position;Support pillows;Education  Discharge    Consult Status Consult Status: Follow-up from L&D Date: 01/30/21 Follow-up type: In-patient    [name]  Nicholson-Springer 01/29/2021, 5:52 PM

## 2021-01-29 NOTE — Progress Notes (Signed)
   PRENATAL VISIT NOTE  Subjective:  Stacey Knight is a 30 y.o. G2P1001 at [redacted]w[redacted]d being seen today for ongoing prenatal care.  She is currently monitored for the following issues for this low-risk pregnancy and has Supervision of other normal pregnancy, antepartum and Language barrier affecting health care on their problem list.  Patient reports no complaints.  Contractions: Not present. Vag. Bleeding: None.  Movement: Present. Denies leaking of fluid.   The following portions of the patient's history were reviewed and updated as appropriate: allergies, current medications, past family history, past medical history, past social history, past surgical history and problem list.   Objective:   Vitals:   01/28/21 1032  BP: 112/77  Pulse: 91  Weight: 163 lb (73.9 kg)    Fetal Status: Fetal Heart Rate (bpm): 130 Fundal Height: 39 cm Movement: Present  Presentation: Vertex  General:  Alert, oriented and cooperative. Patient is in no acute distress.  Skin: Skin is warm and dry. No rash noted.   Cardiovascular: Normal heart rate noted  Respiratory: Normal respiratory effort, no problems with respiration noted  Abdomen: Soft, gravid, appropriate for gestational age.  Pain/Pressure: Present     Pelvic: Cervical exam performed in the presence of a chaperone Dilation: 2.5 Effacement (%): 50 Station: -2  Extremities: Normal range of motion.  Edema: None  Mental Status: Normal mood and affect. Normal behavior. Normal judgment and thought content.   Assessment and Plan:  Pregnancy: G2P1001 at [redacted]w[redacted]d 1. Supervision of other normal pregnancy, antepartum Pt has transportation issues so patient set up for this weekend (Saturday @ 2345) for IOL. GBS neg  Interpreter used  Term labor symptoms and general obstetric precautions including but not limited to vaginal bleeding, contractions, leaking of fluid and fetal movement were reviewed in detail with the patient. Please refer to After Visit Summary for  other counseling recommendations.   Return if symptoms worsen or fail to improve.  Future Appointments  Date Time Provider Department Center  01/31/2021 12:00 AM [facility] SCHED ROOM [facility] None    Briny Breezes Bing, MD

## 2021-01-29 NOTE — Plan of Care (Signed)
°  Problem: Education: °Goal: Knowledge of Childbirth will improve °Outcome: Progressing °Goal: Ability to make informed decisions regarding treatment and plan of care will improve °Outcome: Progressing °  °Problem: Education: °Goal: Knowledge of General Education information will improve °Description: Including pain rating scale, medication(s)/side effects and non-pharmacologic comfort measures °Outcome: Progressing °  °

## 2021-01-29 NOTE — MAU Note (Signed)
...  Stacey Knight is a 30 y.o. at [redacted]w[redacted]d here in MAU reporting: CTX that "just started a few minutes ago." Denies LOF or vaginal bleeding. Endorses FM.   IOL Saturday evening, 01/30/2021 at 2345.    Lab orders placed from triage:  MAU Labor Eval

## 2021-01-30 DIAGNOSIS — Z8742 Personal history of other diseases of the female genital tract: Secondary | ICD-10-CM | POA: Diagnosis not present

## 2021-01-30 DIAGNOSIS — N855 Inversion of uterus: Secondary | ICD-10-CM | POA: Diagnosis not present

## 2021-01-30 LAB — RPR: RPR Ser Ql: NONREACTIVE

## 2021-01-30 MED ORDER — IBUPROFEN 600 MG PO TABS: 600.0000 mg | ORAL_TABLET | Freq: Four times a day (QID) | ORAL | 0 refills | Status: DC | PRN

## 2021-01-30 NOTE — Discharge Summary (Addendum)
Postpartum Discharge Summary     Patient Name: Stacey Knight DOB: 1990-11-05 MRN: [id number]  Date of admission: 01/29/2021 Delivery date:01/29/2021  Delivering provider: Gifford Shave  Date of discharge: 01/31/2021  Admitting diagnosis: Supervision of normal pregnancy [Z34.90] Intrauterine pregnancy: [redacted]w[redacted]d     Secondary diagnosis:  Active Problems:   Language barrier affecting health care   Supervision of normal pregnancy   Uterine inversion  Additional problems: none    Discharge diagnosis: Term Pregnancy Delivered                                              Post partum procedures: none Augmentation: AROM Complications: Uterine Inversion; SD <1 min  Hospital course: Onset of Labor With Vaginal Delivery      30 y.o. yo [id number] at [redacted]w[redacted]d was admitted in Active Labor on 01/29/2021. Patient had a labor course remarkable for fetal brady necessitating a VAVD followed by a SD with total time <1 min; with delivery of the placenta she had a uterine inversion (see Delivery Summary for details).  Membrane Rupture Time/Date: 2:44 PM ,01/29/2021   Delivery Method:Vaginal, Vacuum (Extractor)  Episiotomy: None  Lacerations:  2nd degree  Patient had an uncomplicated postpartum course.  She is ambulating, tolerating a regular diet, passing flatus, and urinating well. Patient is discharged home in stable condition on 01/30/21.  Newborn Data: Birth date:01/29/2021  Birth time:4:33 PM  Gender:Female  Living status:Living  Apgars:5 ,9  Weight:3949 g (8 lb 11.3oz)  Magnesium Sulfate received: No BMZ received: No Rhophylac:N/A MMR:N/A T-DaP: given prenatally (10/12/20 in [place]) Flu: No Transfusion:No  Physical exam  Vitals:   01/30/21 0943 01/30/21 1500 01/30/21 2138 01/31/21 0614  BP: 90/60 (!) 88/62 103/67 97/71  Pulse: 77 70 81 72  Resp: $Remo'18 20 17 20  'Agsae$ Temp: 99.3 F (37.4 C) 98.9 F (37.2 C) 98.1 F (36.7 C) 97.9 F (36.6 C)  TempSrc:   Oral Oral  SpO2:   100% 100%  Weight:       Height:       General: alert and cooperative Lochia: appropriate Uterine Fundus: firm Incision: N/A DVT Evaluation: No evidence of DVT seen on physical exam. Labs: Lab Results  Component Value Date   WBC 9.4 01/29/2021   HGB 12.8 01/29/2021   HCT 37.5 01/29/2021   MCV 85.4 01/29/2021   PLT 236 01/29/2021   No flowsheet data found. Edinburgh Score: Edinburgh Postnatal Depression Scale Screening Tool 01/29/2021  I have been able to laugh and see the funny side of things. 0  I have looked forward with enjoyment to things. 0  I have blamed myself unnecessarily when things went wrong. 0  I have been anxious or worried for no good reason. 0  I have felt scared or panicky for no good reason. 1  Things have been getting on top of me. 0  I have been so unhappy that I have had difficulty sleeping. 0  I have felt sad or miserable. 0  I have been so unhappy that I have been crying. 0  The thought of harming myself has occurred to me. 0  Edinburgh Postnatal Depression Scale Total 1     After visit meds:  Allergies as of 01/30/2021   No Known Allergies      Medication List     TAKE these medications    ibuprofen 600  MG tablet Commonly known as: ADVIL Take 1 tablet (600 mg total) by mouth every 6 (six) hours as needed.         Discharge home in stable condition Infant Feeding: Bottle and Breast Infant Disposition:home with mother Discharge instruction: per After Visit Summary and Postpartum booklet. Activity: Advance as tolerated. Pelvic rest for 6 weeks.  Diet: routine diet Future Appointments:No future appointments. Follow up Visit:  Myrtis Ser, CNM  P [facility] Admin Pool Please schedule this patient for Postpartum visit in: 4 weeks with the following provider: Any provider  In-Person  For C/S patients schedule nurse incision check in weeks 2 weeks: no  Low risk pregnancy complicated by: uterine inversion after delivery  Delivery mode:  SVD  Anticipated  Birth Control:  other/unsure  PP Procedures needed: none  Schedule Integrated [place] visit: no    01/31/2021 Renee Harder, CNM 10:43 AM

## 2021-01-31 ENCOUNTER — Inpatient Hospital Stay (HOSPITAL_COMMUNITY): Payer: Medicaid Other

## 2021-01-31 ENCOUNTER — Inpatient Hospital Stay (HOSPITAL_COMMUNITY)
Admission: AD | Admit: 2021-01-31 | Payer: Medicaid Other | Source: Home / Self Care | Admitting: Obstetrics & Gynecology

## 2021-01-31 NOTE — Lactation Note (Signed)
This note was copied from a baby's chart. Lactation Consultation Note  Patient Name: Girl Raechelle Sarti LUNGB'M Date: 01/31/2021 Reason for consult: Initial assessment;Term Age:31 hours LC entered the room, infant was cuing to breastfeed. Per mom, she has been using formula due to infant not latching well at the breast most feeding has been 3 minutes or less.  Mom never tried the football hold position  and she has mainly been breastfeeding infant swaddle in clothing and blankets. LC discussed the importance of breastfeeding infant skin to skin and using breast stimulation techniques to keep infant awake to breastfeed. Mom open to trying this position, LC ask mom to hand express small amount of colostrum out prior to latching infant at the breast, mom latched infant on her right breast using the football hold position, infant latched with depth. Infant was still breastfeeding after 15 minutes when LC left the room. Per mom, she has used the DEBP only few times, has not been pumping every 3 hours for 15 minutes as recommended by RN.  Mom's current plan: 1- Mom will breastfeed infant according to feeding cues, 8 to 12+ times or more within 24 hours, skin to skin. 2- Mom will latch infant going forward for every feeding and will ask RN/LC for latch assistance if needed. 3- After latching infant at breast ( mom's choice) mom will supplement infant with 15 mls of formula every feeding on Day 2 of life . 4- Mom will continue to use DEBP every 3 hours for 15 minutes on initial setting and give infant back any EBM first before offering formula.  Maternal Data Has patient been taught Hand Expression?: Yes Does the patient have breastfeeding experience prior to this delivery?: Yes How long did the patient breastfeed?: Per mom, she breastfeed her 1st child for 14 months , her first child is currently 56 years old.  Feeding Mother's Current Feeding Choice: Breast Milk and Formula  LATCH Score Latch:  Grasps breast easily, tongue down, lips flanged, rhythmical sucking.  Audible Swallowing: Spontaneous and intermittent  Type of Nipple: Everted at rest and after stimulation  Comfort (Breast/Nipple): Soft / non-tender  Hold (Positioning): Assistance needed to correctly position infant at breast and maintain latch.  LATCH Score: 9   Lactation Tools Discussed/Used    Interventions Interventions: Breast feeding basics reviewed;Assisted with latch;Skin to skin;Hand express;Breast compression;Adjust position;Support pillows;Position options;Expressed milk;DEBP;Hand pump;Education;Pace feeding  Discharge Pump: Manual;DEBP WIC Program: No  Consult Status Consult Status: Follow-up Date: 01/31/21 Follow-up type: In-patient    Danelle Earthly 01/31/2021, 12:53 AM

## 2021-01-31 NOTE — Lactation Note (Signed)
This note was copied from a baby's chart. Lactation Consultation Note  Patient Name: Stacey Knight [id number] Date: 01/31/2021 Reason for consult: Follow-up assessment;Term;Infant weight loss;Other (Comment) (2 % weight loss/ per mom " no milk yet " . LC reassured mom and recommended to work on the latching of both breast for 15 - 20 mins prior to giving formula. Supply and demand.) Age:31 hours LC also suggested until milk comes in prior to latching, warm moist towel apply to breast . Breast massage , hand express, pre- pump and latch .  Take the baby to the breast calm. If needed give and appetizer of EBM or formula.  Then latch. If baby is satisfied after feeding both breast hold off on supplement.   Maternal Data    Feeding Mother's Current Feeding Choice: Breast Milk and Formula  LATCH Score                    Lactation Tools Discussed/Used    Interventions    Discharge Discharge Education: Engorgement and breast care Pump: Manual  Consult Status Consult Status: Complete Date: 01/31/21    Stacey Knight 01/31/2021, 12:07 PM

## 2021-02-02 LAB — SURGICAL PATHOLOGY

## 2021-02-02 NOTE — Progress Notes (Signed)
Post Partum Day #1 Subjective: no complaints, up ad lib, and tolerating PO; declines contraception for now; breast and bottlefeeding  Objective: Blood pressure 97/71, pulse 72, temperature 97.9 F (36.6 C), temperature source Oral, resp. rate 20, height 6\' 1"  (1.854 m), weight 73.9 kg, last menstrual period 04/26/2020, SpO2 100 %, unknown if currently breastfeeding.  Physical Exam:  General: alert, cooperative, and no distress Lochia: appropriate Uterine Fundus: firm DVT Evaluation: No evidence of DVT seen on physical exam.  No results for input(s): HGB, HCT in the last 72 hours.  Assessment/Plan: Plan for discharge tomorrow   LOS: 2 days   04/28/2020 CNM

## 2021-02-09 ENCOUNTER — Telehealth (HOSPITAL_COMMUNITY): Payer: Self-pay | Admitting: *Deleted

## 2021-02-09 NOTE — Telephone Encounter (Signed)
Hospital discharge follow-up call made with [employer], [phone]. Patient voiced no questions or concerns regarding her own health. EPDS = 0. Patient voiced no questions or concerns regarding baby at this time. Patient reported infant sleeps in a crib on her back. RN reviewed ABCs of safe sleep - patient verbalized understanding. Deforest Hoyles, RN, 02/09/21, 1743.

## 2021-03-01 ENCOUNTER — Ambulatory Visit: Payer: Medicaid Other | Admitting: Obstetrics and Gynecology

## 2021-04-14 ENCOUNTER — Ambulatory Visit
Admission: RE | Admit: 2021-04-14 | Discharge: 2021-04-14 | Disposition: A | Discharge status: Home / Self Care | Payer: No Typology Code available for payment source | Source: Ambulatory Visit | Attending: Obstetrics and Gynecology | Admitting: Obstetrics and Gynecology

## 2021-04-14 ENCOUNTER — Other Ambulatory Visit: Payer: Self-pay | Admitting: Obstetrics and Gynecology

## 2021-04-14 DIAGNOSIS — R7611 Nonspecific reaction to tuberculin skin test without active tuberculosis: Secondary | ICD-10-CM

## 2022-05-02 NOTE — L&D Delivery Note (Signed)
OB/GYN Faculty Practice Delivery Note  Stacey Knight is a 32 y.o. G3P2002 s/p nsvd at [redacted]w[redacted]d. She was admitted for active labor.   ROM: 1h 7m with meconium-stained fluid GBS Status:  Negative/-- (05/10 1101)  Labor Progress: Initial SVE: 9.5 in the MAU. She then progressed to complete.   Delivery Date/Time: 10:27 Delivery: Called to room and patient was complete and pushing. Head delivered OA. No nuchal cord present. Shoulder and body delivered in usual fashion. Infant with spontaneous cry, placed on mother's abdomen, dried and stimulated. Cord clamped x 2 after 1-minute delay, and cut.. Cord blood drawn. Placenta delivered spontaneously with gentle cord traction. Fundus firm with massage and Pitocin though there was a steady light trickle and so methergine x1 was given. Labia, perineum, vagina, and cervix inspected inspected with right side wall and second degree laceration noted.  Baby Weight: pending  Placenta: Sent to L&D Complications: None Lacerations: right vaginal side-wall and second degree perineal repaired with 3-0 vicryl in standard fashion EBL: 250 mL Analgesia: none  Infant:  APGAR (1 MIN): 9   APGAR (5 MINS): 9   APGAR (10 MINS):     Shonna Chock, MD Center for [facility], [facility] 10/05/2022, 10:58 AM

## 2022-05-03 ENCOUNTER — Ambulatory Visit (INDEPENDENT_AMBULATORY_CARE_PROVIDER_SITE_OTHER): Payer: Medicaid Other

## 2022-05-03 DIAGNOSIS — Z3201 Encounter for pregnancy test, result positive: Secondary | ICD-10-CM | POA: Diagnosis not present

## 2022-05-03 DIAGNOSIS — Z3200 Encounter for pregnancy test, result unknown: Secondary | ICD-10-CM

## 2022-05-03 LAB — POCT PREGNANCY, URINE: Preg Test, Ur: POSITIVE — AB

## 2022-05-03 NOTE — Progress Notes (Signed)
I connected with  Sevannah Wollschlager on 05/03/22 at  1:30 PM EST by telephone using [place] [phone]. I verified that I am speaking with the correct person using two identifiers.   I discussed the limitations, risks, security and privacy concerns of performing an evaluation and management service by telephone and the availability of in person appointments. I also discussed with the patient that there may be a patient responsible charge related to this service. The patient expressed understanding and agreed to proceed.  Possible Pregnancy  Here today for pregnancy confirmation. UPT in office today is positive. Pt reports this is her first positive pregnancy test. Reviewed dating with patient:   LMP: 02/26/2022 EDD: 10/03/2022 18w 1d today  OB history reviewed. Reviewed medications and allergies with patient; list of medications safe to take during pregnancy given.  Recommended pt begin prenatal vitamin and schedule prenatal care.New OB intake scheduled for 01/25 at 10:15. Pt aware.   Martina Sinner, RN 05/03/2022  2:16 PM

## 2022-05-26 ENCOUNTER — Ambulatory Visit (INDEPENDENT_AMBULATORY_CARE_PROVIDER_SITE_OTHER): Payer: Medicaid Other

## 2022-05-26 VITALS — BP 112/75 | HR 86

## 2022-05-26 DIAGNOSIS — Z3480 Encounter for supervision of other normal pregnancy, unspecified trimester: Secondary | ICD-10-CM

## 2022-05-26 DIAGNOSIS — Z3490 Encounter for supervision of normal pregnancy, unspecified, unspecified trimester: Secondary | ICD-10-CM

## 2022-05-26 MED ORDER — BLOOD PRESSURE KIT DEVI: 1.0000 | 0 refills | Status: DC | PRN

## 2022-05-26 MED ORDER — GOJJI WEIGHT SCALE MISC: 1.0000 | 0 refills | Status: DC | PRN

## 2022-05-26 NOTE — Patient Instructions (Signed)

## 2022-05-26 NOTE — Progress Notes (Signed)
New OB Intake   I explained I am completing New OB Intake today. We discussed EDD of 12/03/2022 that is based on LMP of 12/28/22. Pt is G3/P2. I reviewed her allergies, medications, Medical/Surgical/OB history, and appropriate screenings. I informed her of [facility] services. [facility] information placed in AVS. Based on history, this is a low risk pregnancy.  Patient Active Problem List   Diagnosis Date Noted   Uterine inversion 01/30/2021   Supervision of normal pregnancy 01/29/2021   Supervision of other normal pregnancy, antepartum 11/26/2020   Language barrier affecting health care 11/26/2020    Concerns addressed today No concerns today  Delivery Plans Plans to deliver at [facility]. Patient given information for [facility] Healthy Baby website for more information about Women's and [place]. Patient is not interested in water birth. Offered upcoming OB visit with CNM to discuss further.  MyChart/Babyscripts MyChart access verified. I explained pt will have some visits in office and some virtually. Babyscripts instructions given and order placed. Patient verifies receipt of registration text/e-mail. Account successfully created and app downloaded.  Blood Pressure Cuff/Weight Scale Blood pressure cuff ordered for patient to pick-up from [employer]. Explained after first prenatal appt pt will check weekly and document in 2. Patient does not have weight scale; order sent to [place], patient may track weight weekly in Babyscripts.  Anatomy US Explained first scheduled [place] will be around 19 weeks. Anatomy US scheduled for March 11 at 1245. Pt notified.   Labs Discussed Johnsie Cancel genetic screening with patient. Would like both Panorama and Horizon drawn at new OB visit. Routine prenatal labs needed.  COVID Vaccine Patient has had COVID vaccine.   Is patient a CenteringPregnancy candidate?  Not a Candidate Declined due to  English is second language     Is patient a  Mom+Baby Combined Care candidate?  Not a candidate   If accepted, Mom+Baby staff notified  Social Determinants of Health Food Insecurity: Patient denies food insecurity. WIC Referral: Patient is interested in referral to [facility].  Transportation: Patient denies transportation needs. Childcare: Discussed no children allowed at ultrasound appointments. Offered childcare services; patient declines childcare services at this time.  First visit review I reviewed new OB appt with patient. I explained they will have a provider visit that may include a pap smear. Explained pt will be seen by Ilda Basset, MD at first visit; encounter routed to appropriate provider. Explained that patient will be seen by pregnancy navigator following visit with provider.   Verdell Carmine, RN 05/26/2022  10:22 AM

## 2022-05-27 LAB — CBC/D/PLT+RPR+RH+ABO+RUBIGG...
Antibody Screen: NEGATIVE
Basophils Absolute: 0.1 10*3/uL (ref 0.0–0.2)
Basos: 1 %
EOS (ABSOLUTE): 0.6 10*3/uL — ABNORMAL HIGH (ref 0.0–0.4)
Eos: 6 %
HCV Ab: NONREACTIVE
HIV Screen 4th Generation wRfx: NONREACTIVE
Hematocrit: 35.7 % (ref 34.0–46.6)
Hemoglobin: 12 g/dL (ref 11.1–15.9)
Hepatitis B Surface Ag: NEGATIVE
Immature Grans (Abs): 0 10*3/uL (ref 0.0–0.1)
Immature Granulocytes: 0 %
Lymphocytes Absolute: 2.4 10*3/uL (ref 0.7–3.1)
Lymphs: 26 %
MCH: 29.6 pg (ref 26.6–33.0)
MCHC: 33.6 g/dL (ref 31.5–35.7)
MCV: 88 fL (ref 79–97)
Monocytes Absolute: 0.8 10*3/uL (ref 0.1–0.9)
Monocytes: 8 %
Neutrophils Absolute: 5.4 10*3/uL (ref 1.4–7.0)
Neutrophils: 59 %
Platelets: 252 10*3/uL (ref 150–450)
RBC: 4.06 x10E6/uL (ref 3.77–5.28)
RDW: 14.2 % (ref 11.7–15.4)
RPR Ser Ql: NONREACTIVE
Rh Factor: POSITIVE
Rubella Antibodies, IGG: 5.2 index (ref >0.99)
WBC: 9.2 10*3/uL (ref 3.4–10.8)

## 2022-05-27 LAB — HCV INTERPRETATION

## 2022-05-27 LAB — HEMOGLOBIN A1C
Est. average glucose Bld gHb Est-mCnc: 105 mg/dL
Hgb A1c MFr Bld: 5.3 % (ref 4.8–5.6)

## 2022-05-28 LAB — AFP, SERUM, OPEN SPINA BIFIDA
AFP MoM: 1.2
AFP Value: 78.4 ng/mL
Gest. Age on Collection Date: 21.4 weeks
Maternal Age At EDD: 33.4 yr
OSBR Risk 1 IN: 6499
Test Results:: NEGATIVE
Weight: 163 [lb_av]

## 2022-05-28 LAB — URINE CULTURE, OB REFLEX

## 2022-05-28 LAB — CULTURE, OB URINE

## 2022-05-30 ENCOUNTER — Ambulatory Visit: Payer: Medicaid Other | Attending: Obstetrics and Gynecology

## 2022-05-30 DIAGNOSIS — Z363 Encounter for antenatal screening for malformations: Secondary | ICD-10-CM | POA: Insufficient documentation

## 2022-05-30 DIAGNOSIS — Z3480 Encounter for supervision of other normal pregnancy, unspecified trimester: Secondary | ICD-10-CM | POA: Diagnosis present

## 2022-05-30 DIAGNOSIS — Z3A22 22 weeks gestation of pregnancy: Secondary | ICD-10-CM | POA: Diagnosis not present

## 2022-05-30 LAB — POCT URINALYSIS DIP (DEVICE)
Bilirubin Urine: NEGATIVE
Glucose, UA: NEGATIVE mg/dL
Hgb urine dipstick: NEGATIVE
Ketones, ur: NEGATIVE mg/dL
Nitrite: NEGATIVE
Protein, ur: NEGATIVE mg/dL
Specific Gravity, Urine: 1.02 (ref 1.005–1.030)
Urobilinogen, UA: 0.2 mg/dL (ref 0.0–1.0)
pH: 7 (ref 5.0–8.0)

## 2022-06-04 LAB — PANORAMA PRENATAL TEST FULL PANEL:PANORAMA TEST PLUS 5 ADDITIONAL MICRODELETIONS: FETAL FRACTION: 7.9

## 2022-06-06 ENCOUNTER — Encounter: Payer: Self-pay | Admitting: *Deleted

## 2022-06-06 LAB — HORIZON CUSTOM: REPORT SUMMARY: POSITIVE — AB

## 2022-06-07 ENCOUNTER — Encounter: Payer: Self-pay | Admitting: Obstetrics and Gynecology

## 2022-06-07 DIAGNOSIS — O285 Abnormal chromosomal and genetic finding on antenatal screening of mother: Secondary | ICD-10-CM | POA: Insufficient documentation

## 2022-06-09 ENCOUNTER — Encounter: Payer: Self-pay | Admitting: Obstetrics and Gynecology

## 2022-06-09 ENCOUNTER — Ambulatory Visit (INDEPENDENT_AMBULATORY_CARE_PROVIDER_SITE_OTHER): Payer: Medicaid Other | Admitting: Obstetrics and Gynecology

## 2022-06-09 ENCOUNTER — Other Ambulatory Visit (HOSPITAL_COMMUNITY)
Admission: RE | Admit: 2022-06-09 | Discharge: 2022-06-09 | Disposition: A | Discharge status: Home / Self Care | Payer: Medicaid Other | Source: Ambulatory Visit | Attending: Obstetrics and Gynecology | Admitting: Obstetrics and Gynecology

## 2022-06-09 VITALS — BP 103/68 | HR 92 | Wt 163.0 lb

## 2022-06-09 DIAGNOSIS — Z3480 Encounter for supervision of other normal pregnancy, unspecified trimester: Secondary | ICD-10-CM | POA: Diagnosis present

## 2022-06-09 DIAGNOSIS — O09292 Supervision of pregnancy with other poor reproductive or obstetric history, second trimester: Secondary | ICD-10-CM

## 2022-06-09 DIAGNOSIS — N855 Inversion of uterus: Secondary | ICD-10-CM | POA: Diagnosis not present

## 2022-06-09 DIAGNOSIS — Z3A23 23 weeks gestation of pregnancy: Secondary | ICD-10-CM

## 2022-06-09 DIAGNOSIS — O0930 Supervision of pregnancy with insufficient antenatal care, unspecified trimester: Secondary | ICD-10-CM | POA: Insufficient documentation

## 2022-06-09 DIAGNOSIS — O285 Abnormal chromosomal and genetic finding on antenatal screening of mother: Secondary | ICD-10-CM

## 2022-06-09 DIAGNOSIS — O0932 Supervision of pregnancy with insufficient antenatal care, second trimester: Secondary | ICD-10-CM | POA: Diagnosis not present

## 2022-06-09 DIAGNOSIS — O09299 Supervision of pregnancy with other poor reproductive or obstetric history, unspecified trimester: Secondary | ICD-10-CM | POA: Insufficient documentation

## 2022-06-09 NOTE — Progress Notes (Signed)
New OB Note  06/09/2022   Clinic: [facility]  Chief Complaint: new OB  Transfer of Care Patient: no  History of Present Illness: Stacey Knight is a 32 y.o. [id number] @ 23/3 weeks ([place] 6/3, based on Patient's last menstrual period was 12/27/2021.=22wk u/s).  Preg complicated by has Supervision of other normal pregnancy, antepartum; Language barrier affecting health care; Uterine inversion; Increased SMA risk; History of shoulder dystocia in prior pregnancy, currently pregnant in second trimester; and Late prenatal care on their problem list.   No OB s/s.   ROS: A 12-point review of systems was performed and negative, except as stated in the above HPI.  OBGYN History: As per HPI. OB History  Gravida Para Term Preterm AB Living  3 2 2     2   SAB IAB Ectopic Multiple Live Births        0 2    # Outcome Date GA Lbr Len/2nd Weight Sex Delivery Anes PTL Lv  3 Current           2 Term 01/29/21 [redacted]w[redacted]d 01:33 / 00:16 8 lb 11.3 oz (3.949 kg) F Vag-Vacuum Local  LIV     Birth Comments: wnl  1 Term 01/05/18   7 lb 0.9 oz (3.2 kg) M Vag-Spont None N LIV    Prior children are healthy, doing well, and without any problems or issues: yes History of pap smears: No.    Past Medical History: Past Medical History:  Diagnosis Date   Medical history non-contributory     Past Surgical History: Past Surgical History:  Procedure Laterality Date   NO PAST SURGERIES      Family History:  Family History  Problem Relation Age of Onset   Healthy Mother    Healthy Father    Diabetes Neg Hx    Hypertension Neg Hx     Social History:  Social History   Socioeconomic History   Marital status: Unknown    Spouse name: Not on file   Number of children: Not on file   Years of education: Not on file   Highest education level: Not on file  Occupational History   Not on file  Tobacco Use   Smoking status: Never   Smokeless tobacco: Never  Vaping Use   Vaping Use:  Never used  Substance and Sexual Activity   Alcohol use: Never   Drug use: Never   Sexual activity: Yes    Birth control/protection: None  Other Topics Concern   Not on file  Social History Narrative   Not on file   Social Determinants of Health   Financial Resource Strain: Not on file  Food Insecurity: No Food Insecurity (01/27/2021)   Hunger Vital Sign    Worried About Running Out of Food in the Last Year: Never true    Ran Out of Food in the Last Year: Never true  Transportation Needs: Unmet Transportation Needs (01/27/2021)   PRAPARE - Hydrologist (Medical): Yes    Lack of Transportation (Non-Medical): Yes  Physical Activity: Not on file  Stress: Not on file  Social Connections: Not on file  Intimate Partner Violence: Not on file   Allergy: No Known Allergies  Current Outpatient Medications: Prenatal vitamin  Physical Exam:   BP 103/68   Pulse 92   Wt 163 lb (73.9 kg)   LMP 12/27/2021   BMI 21.51 kg/m  Body mass index is 21.51 kg/m. Contractions: Not present  Vag. Bleeding: None. Fundal height: 24 FHTs: 140s  General appearance: Well nourished, well developed female in no acute distress.  Neck:  Supple, normal appearance, and no thyromegaly  Cardiovascular: S1, S2 normal, no murmur, rub or gallop, regular rate and rhythm Respiratory:  Clear to auscultation bilateral. Normal respiratory effort Abdomen: gravid, nttp nd Neuro/Psych:  Normal mood and affect.  Skin:  Warm and dry.  Lymphatic:  No inguinal lymphadenopathy.   Pelvic exam: is not limited by body habitus EGBUS: within normal limits, Vagina: within normal limits and with  whtie cottage cheese like d/c in vault, no  blood in the vault, Cervix: normal appearing cervix without discharge or lesions, closed/long/high, Uterus:  enlarged, c/w 24 week size, and Adnexa:  normal adnexa and no mass, fullness, tenderness  Laboratory: reviewed  Imaging:  Negative mfm anatomy  u/s  Assessment: patient doing well  Plan: 1. Supervision of other normal pregnancy, antepartum Routine care - Cytology - PAP( [place])  2. History of shoulder dystocia in prior pregnancy, currently pregnant in second trimester 12/2020: urgent VAVD done due to fetal bradycardia. SD <39m and relieved with [place] and SP pressure. 3949gm. Prior 3200gm SVD w/o issue.  Patient states baby did not have any deficits and no problems or issues. I told her I dont feel, at this point, she needs another c/s as she had risk factors to include an operative vag delivery and that baby being significantly bigger than her first one. Recommend 36wk growth u/s and can d/w pt more re: best delivery mode.   3. Increased SMA risk D/w pt and  pt to let us know if wants FOB testing  4. Uterine inversion Resolved with pit off, terb given and manual replacement  5. Late prenatal care  Problem list reviewed and updated.  Follow up in 3 weeks.  >50% of 25 min visit spent on counseling and coordination of care.     Durene Romans MD Attending Center for [facility])

## 2022-06-14 LAB — CYTOLOGY - PAP
Chlamydia: NEGATIVE
Comment: NEGATIVE
Comment: NEGATIVE
Comment: NORMAL
Diagnosis: NEGATIVE
High risk HPV: NEGATIVE
Neisseria Gonorrhea: NEGATIVE

## 2022-07-04 ENCOUNTER — Other Ambulatory Visit: Payer: Self-pay | Admitting: General Practice

## 2022-07-04 ENCOUNTER — Ambulatory Visit (INDEPENDENT_AMBULATORY_CARE_PROVIDER_SITE_OTHER): Payer: Medicaid Other | Admitting: Obstetrics & Gynecology

## 2022-07-04 ENCOUNTER — Encounter: Payer: Self-pay | Admitting: Obstetrics & Gynecology

## 2022-07-04 ENCOUNTER — Other Ambulatory Visit: Payer: Medicaid Other

## 2022-07-04 VITALS — BP 102/68 | HR 82 | Wt 163.6 lb

## 2022-07-04 DIAGNOSIS — Z23 Encounter for immunization: Secondary | ICD-10-CM | POA: Diagnosis not present

## 2022-07-04 DIAGNOSIS — Z3A27 27 weeks gestation of pregnancy: Secondary | ICD-10-CM

## 2022-07-04 DIAGNOSIS — Z3480 Encounter for supervision of other normal pregnancy, unspecified trimester: Secondary | ICD-10-CM

## 2022-07-04 DIAGNOSIS — O09292 Supervision of pregnancy with other poor reproductive or obstetric history, second trimester: Secondary | ICD-10-CM

## 2022-07-04 NOTE — Patient Instructions (Addendum)
Return to office for any scheduled appointments. Call the office or go to the MAU at [place] at [facility] if: You begin to have strong, frequent contractions Your water breaks.  Sometimes it is a big gush of fluid, sometimes it is just a trickle that keeps getting your underwear wet or running down your legs You have vaginal bleeding.  It is normal to have a small amount of spotting if your cervix was checked.  You do not feel your baby moving like normal.  If you do not, get something to eat and drink and lay down and focus on feeling your baby move.   If your baby is still not moving like normal, you should call the office or go to MAU. Any other obstetric concerns.   TDaP Vaccine Pregnancy Get the Whooping Cough Vaccine While You Are Pregnant (CDC)  It is important for women to get the whooping cough vaccine in the third trimester of each pregnancy. Vaccines are the best way to prevent this disease. There are 2 different whooping cough vaccines. Both vaccines combine protection against whooping cough, tetanus and diphtheria, but they are for different age groups: Tdap: for everyone 11 years or older, including pregnant women  DTaP: for children 2 months through 61 years of age  You need the whooping cough vaccine during each of your pregnancies The recommended time to get the shot is during your 27th through 36th week of pregnancy, preferably during the earlier part of this time period. The Centers for Disease Control and Prevention (CDC) recommends that pregnant women receive the whooping cough vaccine for adolescents and adults (called Tdap vaccine) during the third trimester of each pregnancy. The recommended time to get the shot is during your 27th through 36th week of pregnancy, preferably during the earlier part of this time period. This replaces the original recommendation that pregnant women get the vaccine only if they had not previously received it. The [employer] of Obstetricians and Gynecologists and the [employer] support this recommendation.  You should get the whooping cough vaccine while pregnant to pass protection to your baby frame support disabled and/or not supported in this browser  Learn why Mickel Baas decided to get the whooping cough vaccine in her 3rd trimester of pregnancy and how her baby girl was born with some protection against the disease. Also available on YouTube. After receiving the whooping cough vaccine, your body will create protective antibodies (proteins produced by the body to fight off diseases) and pass some of them to your baby before birth. These antibodies provide your baby some short-term protection against whooping cough in early life. These antibodies can also protect your baby from some of the more serious complications that come along with whooping cough. Your protective antibodies are at their highest about 2 weeks after getting the vaccine, but it takes time to pass them to your baby. So the preferred time to get the whooping cough vaccine is early in your third trimester. The amount of whooping cough antibodies in your body decreases over time. That is why CDC recommends you get a whooping cough vaccine during each pregnancy. Doing so allows each of your babies to get the greatest number of protective antibodies from you. This means each of your babies will get the best protection possible against this disease.  Getting the whooping cough vaccine while pregnant is better than getting the vaccine after you give birth Whooping cough vaccination during pregnancy is ideal so  your baby will have short-term protection as soon as he is born. This early protection is important because your baby will not start getting his whooping cough vaccines until he is 2 months old. These first few months of life are when your baby is at greatest risk for catching whooping cough. This is also when he's at greatest  risk for having severe, potentially life-threating complications from the infection. To avoid that gap in protection, it is best to get a whooping cough vaccine during pregnancy. You will then pass protection to your baby before he is born. To continue protecting your baby, he should get whooping cough vaccines starting at 2 months old. You may never have gotten the Tdap vaccine before and did not get it during this pregnancy. If so, you should make sure to get the vaccine immediately after you give birth, before leaving the hospital or birthing center. It will take about 2 weeks before your body develops protection (antibodies) in response to the vaccine. Once you have protection from the vaccine, you are less likely to give whooping cough to your newborn while caring for him. But remember, your baby will still be at risk for catching whooping cough from others. A recent study looked to see how effective Tdap was at preventing whooping cough in babies whose mothers got the vaccine while pregnant or in the hospital after giving birth. The study found that getting Tdap between 27 through 36 weeks of pregnancy is 85% more effective at preventing whooping cough in babies younger than 2 months old. Blood tests cannot tell if you need a whooping cough vaccine There are no blood tests that can tell you if you have enough antibodies in your body to protect yourself or your baby against whooping cough. Even if you have been sick with whooping cough in the past or previously received the vaccine, you still should get the vaccine during each pregnancy. Breastfeeding may pass some protective antibodies onto your baby By breastfeeding, you may pass some antibodies you have made in response to the vaccine to your baby. When you get a whooping cough vaccine during your pregnancy, you will have antibodies in your breast milk that you can share with your baby as soon as your milk comes in. However, your baby will not get  protective antibodies immediately if you wait to get the whooping cough vaccine until after delivering your baby. This is because it takes about 2 weeks for your body to create antibodies. Learn more about the health benefits of breastfeeding.

## 2022-07-04 NOTE — Progress Notes (Signed)
   PRENATAL VISIT NOTE  Subjective:  Stacey Knight is a 32 y.o. G3P2002 at 34w0dbeing seen today for ongoing prenatal care.  She is currently monitored for the following issues for this low-risk pregnancy and has Supervision of other normal pregnancy, antepartum; Language barrier affecting health care; History of uterine inversion in 2022 pregnancy after VAVD; Increased SMA risk; History of shoulder dystocia in prior pregnancy, currently pregnant in second trimester; and Late prenatal care on their problem list.  Patient reports no complaints.  Contractions: Not present. Vag. Bleeding: None.  Movement: Present. Denies leaking of fluid.   The following portions of the patient's history were reviewed and updated as appropriate: allergies, current medications, past family history, past medical history, past social history, past surgical history and problem list.   Objective:   Vitals:   07/04/22 0857  BP: 102/68  Pulse: 82  Weight: 163 lb 9.6 oz (74.2 kg)    Fetal Status: Fetal Heart Rate (bpm): 135 Fundal Height: 27 cm Movement: Present     General:  Alert, oriented and cooperative. Patient is in no acute distress.  Skin: Skin is warm and dry. No rash noted.   Cardiovascular: Normal heart rate noted  Respiratory: Normal respiratory effort, no problems with respiration noted  Abdomen: Soft, gravid, appropriate for gestational age.  Pain/Pressure: Absent     Pelvic: Cervical exam deferred        Extremities: Normal range of motion.     Mental Status: Normal mood and affect. Normal behavior. Normal judgment and thought content.   Assessment and Plan:  Pregnancy: G3P2002 at 275w0d. History of shoulder dystocia after VAVD in prior pregnancy, currently pregnant in second trimester Will get growth scan scan at 36 weeks, last baby was almost 4000g.    2. Need for diphtheria-tetanus-pertussis (Tdap) vaccine - Tdap vaccine greater than or equal to 7yo IM given  3. [redacted] weeks gestation of  pregnancy 4. Supervision of other normal pregnancy, antepartum GTT and other labs done today, will follow up results and manage accordingly. Preterm labor symptoms and general obstetric precautions including but not limited to vaginal bleeding, contractions, leaking of fluid and fetal movement were reviewed in detail with the patient. Please refer to After Visit Summary for other counseling recommendations.   Return in about 3 weeks (around 07/25/2022) for OFFICE OB VISIT (MD or APP).  No future appointments.  UgVerita SchneidersMD

## 2022-07-05 LAB — GLUCOSE TOLERANCE, 2 HOURS W/ 1HR
Glucose, 1 hour: 113 mg/dL (ref 70–179)
Glucose, 2 hour: 73 mg/dL (ref 70–152)
Glucose, Fasting: 77 mg/dL (ref 70–91)

## 2022-07-11 ENCOUNTER — Ambulatory Visit: Payer: Medicaid Other

## 2022-07-26 ENCOUNTER — Encounter: Payer: Medicaid Other | Admitting: Family Medicine

## 2022-07-26 DIAGNOSIS — O09292 Supervision of pregnancy with other poor reproductive or obstetric history, second trimester: Secondary | ICD-10-CM

## 2022-07-26 DIAGNOSIS — Z3480 Encounter for supervision of other normal pregnancy, unspecified trimester: Secondary | ICD-10-CM

## 2022-07-27 ENCOUNTER — Ambulatory Visit (INDEPENDENT_AMBULATORY_CARE_PROVIDER_SITE_OTHER): Payer: Medicaid Other | Admitting: Obstetrics & Gynecology

## 2022-07-27 ENCOUNTER — Encounter: Payer: Self-pay | Admitting: Obstetrics & Gynecology

## 2022-07-27 ENCOUNTER — Other Ambulatory Visit: Payer: Self-pay

## 2022-07-27 VITALS — BP 109/74 | HR 88 | Wt 166.8 lb

## 2022-07-27 DIAGNOSIS — O09293 Supervision of pregnancy with other poor reproductive or obstetric history, third trimester: Secondary | ICD-10-CM

## 2022-07-27 DIAGNOSIS — Z3A30 30 weeks gestation of pregnancy: Secondary | ICD-10-CM

## 2022-07-27 DIAGNOSIS — Z3480 Encounter for supervision of other normal pregnancy, unspecified trimester: Secondary | ICD-10-CM

## 2022-07-27 DIAGNOSIS — O09299 Supervision of pregnancy with other poor reproductive or obstetric history, unspecified trimester: Secondary | ICD-10-CM

## 2022-07-27 NOTE — Patient Instructions (Signed)
Return to office for any scheduled appointments. Call the office or go to the MA[facility] at [facility] [place] if: You begin to have strong, frequent contractions Your water breaks.  Sometimes it is a big gush of fluid, sometimes it is just a trickle that keeps getting your underwear wet or running down your legs You have vaginal bleeding.  It is normal to have a small amount of spotting if your cervix was checked.  You do not feel your baby moving like normal.  If you do not, get something to eat and drink and lay down and focus on feeling your baby move.   If your baby is still not moving like normal, you should call the office or go to MAU. Any other obstetric concerns.  

## 2022-07-27 NOTE — Progress Notes (Signed)
[place] sched. On 08/25/22 @ 8:15

## 2022-07-27 NOTE — Progress Notes (Signed)
   PRENATAL VISIT NOTE  Subjective:  Stacey Knight is a 32 y.o. G3P2002 at [redacted]w[redacted]d being seen today for ongoing prenatal care.  She is currently monitored for the following issues for this low-risk pregnancy and has Supervision of other normal pregnancy, antepartum; Language barrier affecting health care; History of uterine inversion in 2022 pregnancy after VAVD; Increased SMA risk; History of shoulder dystocia in prior pregnancy, currently pregnant; and Late prenatal care on their problem list.  Patient reports no complaints.  Contractions: Not present. Vag. Bleeding: None.  Movement: Present. Denies leaking of fluid.   The following portions of the patient's history were reviewed and updated as appropriate: allergies, current medications, past family history, past medical history, past social history, past surgical history and problem list.   Objective:   Vitals:   07/27/22 0936  BP: 109/74  Pulse: 88  Weight: 166 lb 12.8 oz (75.7 kg)    Fetal Status: Fetal Heart Rate (bpm): 143 Fundal Height: 29 cm Movement: Present     General:  Alert, oriented and cooperative. Patient is in no acute distress.  Skin: Skin is warm and dry. No rash noted.   Cardiovascular: Normal heart rate noted  Respiratory: Normal respiratory effort, no problems with respiration noted  Abdomen: Soft, gravid, appropriate for gestational age.  Pain/Pressure: Absent     Pelvic: Cervical exam deferred        Extremities: Normal range of motion.  Edema: None  Mental Status: Normal mood and affect. Normal behavior. Normal judgment and thought content.   Assessment and Plan:  Pregnancy: G3P2002 at [redacted]w[redacted]d 1. History of shoulder dystocia in prior pregnancy, currently pregnant Growth scan scheduled at 36 weeks. - [place] MFM OB FOLLOW UP; Future  2. [redacted] weeks gestation of pregnancy 3. Supervision of other normal pregnancy, antepartum Normal GTT but other third trimester labs not done. Needs to be drawn next visit (patient  offered today but she is unable to stay today).    Preterm labor symptoms and general obstetric precautions including but not limited to vaginal bleeding, contractions, leaking of fluid and fetal movement were reviewed in detail with the patient. Please refer to After Visit Summary for other counseling recommendations.   Return in about 2 weeks (around 08/10/2022) for OFFICE OB VISIT (MD or APP).  No future appointments.  Verita Schneiders, MD

## 2022-08-10 ENCOUNTER — Other Ambulatory Visit: Payer: Self-pay

## 2022-08-10 ENCOUNTER — Ambulatory Visit (INDEPENDENT_AMBULATORY_CARE_PROVIDER_SITE_OTHER): Payer: Medicaid Other | Admitting: Certified Nurse Midwife

## 2022-08-10 VITALS — BP 104/60 | HR 80 | Wt 167.0 lb

## 2022-08-10 DIAGNOSIS — Z3493 Encounter for supervision of normal pregnancy, unspecified, third trimester: Secondary | ICD-10-CM

## 2022-08-10 DIAGNOSIS — Z3480 Encounter for supervision of other normal pregnancy, unspecified trimester: Secondary | ICD-10-CM

## 2022-08-10 DIAGNOSIS — Z3A32 32 weeks gestation of pregnancy: Secondary | ICD-10-CM

## 2022-08-10 NOTE — Progress Notes (Unsigned)
   PRENATAL VISIT NOTE  Subjective:  Stacey Knight is a 32 y.o. G3P2002 at [redacted]w[redacted]d being seen today for ongoing prenatal care.  She is currently monitored for the following issues for this low-risk pregnancy and has Supervision of low-risk pregnancy; Language barrier affecting health care; History of uterine inversion in 2022 pregnancy after VAVD; Increased SMA risk; History of shoulder dystocia in prior pregnancy, currently pregnant; and Late prenatal care on their problem list.  Patient reports no complaints.  Contractions: Not present. Vag. Bleeding: None.  Movement: Present. Denies leaking of fluid.   The following portions of the patient's history were reviewed and updated as appropriate: allergies, current medications, past family history, past medical history, past social history, past surgical history and problem list.   Objective:   Vitals:   08/10/22 1322  BP: 104/60  Pulse: 80  Weight: 167 lb (75.8 kg)    Fetal Status: Fetal Heart Rate (bpm): 144   Movement: Present     General:  Alert, oriented and cooperative. Patient is in no acute distress.  Skin: Skin is warm and dry. No rash noted.   Cardiovascular: Normal heart rate noted  Respiratory: Normal respiratory effort, no problems with respiration noted  Abdomen: Soft, gravid, appropriate for gestational age.  Pain/Pressure: Absent     Pelvic: Cervical exam deferred        Extremities: Normal range of motion.  Edema: None  Mental Status: Normal mood and affect. Normal behavior. Normal judgment and thought content.   Assessment and Plan:  Pregnancy: G3P2002 at [redacted]w[redacted]d 1. Encounter for supervision of low-risk pregnancy in third trimester - Doing well, feeling regular and vigorous fetal movement   2. [redacted] weeks gestation of pregnancy - Routine OB care  - RPR - HIV antibody (with reflex) - CBC  Preterm labor symptoms and general obstetric precautions including but not limited to vaginal bleeding, contractions, leaking of fluid  and fetal movement were reviewed in detail with the patient. Please refer to After Visit Summary for other counseling recommendations.   Return in about 2 weeks (around 08/24/2022) for IN-PERSON, LOB.  Future Appointments  Date Time Provider Department Center  08/25/2022  8:15 AM [facility] NURSE [facility]  08/25/2022  8:30 AM [facility]    Bernerd Limbo, CNM

## 2022-08-11 LAB — CBC
Hematocrit: 36.3 % (ref 34.0–46.6)
Hemoglobin: 11.8 g/dL (ref 11.1–15.9)
MCH: 27.8 pg (ref 26.6–33.0)
MCHC: 32.5 g/dL (ref 31.5–35.7)
MCV: 85 fL (ref 79–97)
Platelets: 238 10*3/uL (ref 150–450)
RBC: 4.25 x10E6/uL (ref 3.77–5.28)
RDW: 12.2 % (ref 11.7–15.4)
WBC: 8.1 10*3/uL (ref 3.4–10.8)

## 2022-08-11 LAB — HIV ANTIBODY (ROUTINE TESTING W REFLEX): HIV Screen 4th Generation wRfx: NONREACTIVE

## 2022-08-11 LAB — RPR: RPR Ser Ql: NONREACTIVE

## 2022-08-25 ENCOUNTER — Other Ambulatory Visit: Payer: Self-pay | Admitting: *Deleted

## 2022-08-25 ENCOUNTER — Ambulatory Visit: Payer: Medicaid Other | Admitting: *Deleted

## 2022-08-25 ENCOUNTER — Ambulatory Visit: Payer: Medicaid Other | Attending: Maternal & Fetal Medicine

## 2022-08-25 VITALS — BP 119/68 | HR 73

## 2022-08-25 DIAGNOSIS — O09293 Supervision of pregnancy with other poor reproductive or obstetric history, third trimester: Secondary | ICD-10-CM | POA: Insufficient documentation

## 2022-08-25 DIAGNOSIS — Z3480 Encounter for supervision of other normal pregnancy, unspecified trimester: Secondary | ICD-10-CM | POA: Insufficient documentation

## 2022-08-25 DIAGNOSIS — Z3689 Encounter for other specified antenatal screening: Secondary | ICD-10-CM

## 2022-08-25 DIAGNOSIS — O09299 Supervision of pregnancy with other poor reproductive or obstetric history, unspecified trimester: Secondary | ICD-10-CM

## 2022-08-25 DIAGNOSIS — Z362 Encounter for other antenatal screening follow-up: Secondary | ICD-10-CM | POA: Diagnosis present

## 2022-08-25 DIAGNOSIS — Z3A34 34 weeks gestation of pregnancy: Secondary | ICD-10-CM | POA: Diagnosis not present

## 2022-08-26 ENCOUNTER — Ambulatory Visit (INDEPENDENT_AMBULATORY_CARE_PROVIDER_SITE_OTHER): Payer: Medicaid Other | Admitting: Medical

## 2022-08-26 ENCOUNTER — Encounter: Payer: Medicaid Other | Admitting: Student

## 2022-08-26 ENCOUNTER — Encounter: Payer: Self-pay | Admitting: Medical

## 2022-08-26 VITALS — BP 118/67 | HR 91 | Wt 167.7 lb

## 2022-08-26 DIAGNOSIS — O09293 Supervision of pregnancy with other poor reproductive or obstetric history, third trimester: Secondary | ICD-10-CM

## 2022-08-26 DIAGNOSIS — Z8742 Personal history of other diseases of the female genital tract: Secondary | ICD-10-CM

## 2022-08-26 DIAGNOSIS — O09299 Supervision of pregnancy with other poor reproductive or obstetric history, unspecified trimester: Secondary | ICD-10-CM

## 2022-08-26 DIAGNOSIS — O0930 Supervision of pregnancy with insufficient antenatal care, unspecified trimester: Secondary | ICD-10-CM

## 2022-08-26 DIAGNOSIS — Z603 Acculturation difficulty: Secondary | ICD-10-CM

## 2022-08-26 DIAGNOSIS — O0933 Supervision of pregnancy with insufficient antenatal care, third trimester: Secondary | ICD-10-CM

## 2022-08-26 DIAGNOSIS — Z3493 Encounter for supervision of normal pregnancy, unspecified, third trimester: Secondary | ICD-10-CM

## 2022-08-26 DIAGNOSIS — Z758 Other problems related to medical facilities and other health care: Secondary | ICD-10-CM

## 2022-08-26 DIAGNOSIS — O285 Abnormal chromosomal and genetic finding on antenatal screening of mother: Secondary | ICD-10-CM

## 2022-08-26 DIAGNOSIS — Z3A34 34 weeks gestation of pregnancy: Secondary | ICD-10-CM

## 2022-08-26 NOTE — Progress Notes (Signed)
   PRENATAL VISIT NOTE  Subjective:  Stacey Knight is a 32 y.o. G3P2002 at [redacted]w[redacted]d being seen today for ongoing prenatal care.  She is currently monitored for the following issues for this high-risk pregnancy and has Supervision of low-risk pregnancy; Language barrier affecting health care; History of uterine inversion in 2022 pregnancy after VAVD; Increased SMA risk; History of shoulder dystocia in prior pregnancy, currently pregnant; and Late prenatal care on their problem list.  Patient reports no complaints.  Contractions: Not present. Vag. Bleeding: None.  Movement: Present. Denies leaking of fluid.   The following portions of the patient's history were reviewed and updated as appropriate: allergies, current medications, past family history, past medical history, past social history, past surgical history and problem list.   Objective:   Vitals:   08/26/22 1057  BP: 118/67  Pulse: 91  Weight: 167 lb 11.2 oz (76.1 kg)    Fetal Status: Fetal Heart Rate (bpm): 134 Fundal Height: 34 cm Movement: Present     General:  Alert, oriented and cooperative. Patient is in no acute distress.  Skin: Skin is warm and dry. No rash noted.   Cardiovascular: Normal heart rate noted  Respiratory: Normal respiratory effort, no problems with respiration noted  Abdomen: Soft, gravid, appropriate for gestational age.  Pain/Pressure: Absent     Pelvic: Cervical exam deferred        Extremities: Normal range of motion.  Edema: None  Mental Status: Normal mood and affect. Normal behavior. Normal judgment and thought content.   Assessment and Plan:  Pregnancy: G3P2002 at [redacted]w[redacted]d 1. Encounter for supervision of low-risk pregnancy in third trimester - Anticipatory guidance for GBS, GC/CT at next visit discussed   2. History of uterine inversion in 2022 pregnancy after VAVD  3. History of shoulder dystocia in prior pregnancy, currently pregnant - last EFW 28% on 4/25 - Plan for repeat growth 5/24 if still  pregnant and will determine MOD based on EFW  4. Language barrier affecting health care - Speaks English   5. Late prenatal care  6. Increased SMA risk  7. [redacted] weeks gestation of pregnancy  Preterm labor symptoms and general obstetric precautions including but not limited to vaginal bleeding, contractions, leaking of fluid and fetal movement were reviewed in detail with the patient. Please refer to After Visit Summary for other counseling recommendations.   Return in about 2 weeks (around 09/09/2022) for LOB, any provider, In-Person.  Future Appointments  Date Time Provider Department Center  09/23/2022 11:15 AM [facility] NURSE [facility]  09/23/2022 11:30 AM [facility]    Vonzella Nipple, PA-C

## 2022-09-09 ENCOUNTER — Ambulatory Visit (INDEPENDENT_AMBULATORY_CARE_PROVIDER_SITE_OTHER): Payer: Medicaid Other | Admitting: Obstetrics and Gynecology

## 2022-09-09 ENCOUNTER — Other Ambulatory Visit (HOSPITAL_COMMUNITY)
Admission: RE | Admit: 2022-09-09 | Discharge: 2022-09-09 | Disposition: A | Discharge status: Home / Self Care | Payer: Medicaid Other | Source: Ambulatory Visit | Attending: Obstetrics and Gynecology | Admitting: Obstetrics and Gynecology

## 2022-09-09 ENCOUNTER — Other Ambulatory Visit: Payer: Self-pay

## 2022-09-09 ENCOUNTER — Encounter: Payer: Self-pay | Admitting: Obstetrics and Gynecology

## 2022-09-09 VITALS — BP 114/71 | HR 79 | Wt 167.2 lb

## 2022-09-09 DIAGNOSIS — Z8742 Personal history of other diseases of the female genital tract: Secondary | ICD-10-CM

## 2022-09-09 DIAGNOSIS — Z3493 Encounter for supervision of normal pregnancy, unspecified, third trimester: Secondary | ICD-10-CM | POA: Diagnosis present

## 2022-09-09 DIAGNOSIS — O0933 Supervision of pregnancy with insufficient antenatal care, third trimester: Secondary | ICD-10-CM

## 2022-09-09 DIAGNOSIS — O09293 Supervision of pregnancy with other poor reproductive or obstetric history, third trimester: Secondary | ICD-10-CM

## 2022-09-09 DIAGNOSIS — Z3A36 36 weeks gestation of pregnancy: Secondary | ICD-10-CM

## 2022-09-09 DIAGNOSIS — O0930 Supervision of pregnancy with insufficient antenatal care, unspecified trimester: Secondary | ICD-10-CM

## 2022-09-09 DIAGNOSIS — O09299 Supervision of pregnancy with other poor reproductive or obstetric history, unspecified trimester: Secondary | ICD-10-CM

## 2022-09-09 NOTE — Progress Notes (Signed)
   PRENATAL VISIT NOTE  Subjective:  Stacey Knight is a 32 y.o. G3P2002 at [redacted]w[redacted]d being seen today for ongoing prenatal care.  She is currently monitored for the following issues for this low-risk pregnancy and has Supervision of low-risk pregnancy; Language barrier affecting health care; History of uterine inversion in 2022 pregnancy after VAVD; Increased SMA risk; History of shoulder dystocia in prior pregnancy, currently pregnant; and Late prenatal care on their problem list.  Patient reports no complaints.  Contractions: Not present. Vag. Bleeding: None.  Movement: Present. Denies leaking of fluid.   The following portions of the patient's history were reviewed and updated as appropriate: allergies, current medications, past family history, past medical history, past social history, past surgical history and problem list.   Objective:   Vitals:   09/09/22 0952  BP: 114/71  Pulse: 79  Weight: 167 lb 3.2 oz (75.8 kg)    Fetal Status: Fetal Heart Rate (bpm): 133 Fundal Height: 35 cm Movement: Present     General:  Alert, oriented and cooperative. Patient is in no acute distress.  Skin: Skin is warm and dry. No rash noted.   Cardiovascular: Normal heart rate noted  Respiratory: Normal respiratory effort, no problems with respiration noted  Abdomen: Soft, gravid, appropriate for gestational age.  Pain/Pressure: Absent     Pelvic: Cervical exam deferred        Extremities: Normal range of motion.  Edema: None  Mental Status: Normal mood and affect. Normal behavior. Normal judgment and thought content.   Assessment and Plan:  Pregnancy: G3P2002 at [redacted]w[redacted]d 1. Encounter for supervision of low-risk pregnancy in third trimester BP and FHR normal  Discussed swabs, collecting today Doing well, no concerns  2. History of shoulder dystocia in prior pregnancy, currently pregnant Hx of vacuum assist delivery, EFW 28% on  4/25 Growth u/s follow up on 5/24  3. History of uterine inversion in  2022 pregnancy after VAVD  4. Late prenatal care   Preterm labor symptoms and general obstetric precautions including but not limited to vaginal bleeding, contractions, leaking of fluid and fetal movement were reviewed in detail with the patient. Please refer to After Visit Summary for other counseling recommendations.   No follow-ups on file.  Future Appointments  Date Time Provider Department Center  09/15/2022  1:15 PM Donna Bernard [facility]  09/23/2022 11:15 AM [facility] NURSE [facility]  09/23/2022 11:30 AM [facility] US2 [facility]    Albertine Grates, FNP

## 2022-09-12 LAB — GC/CHLAMYDIA PROBE AMP (~~LOC~~) NOT AT ARMC
Chlamydia: NEGATIVE
Comment: NEGATIVE
Comment: NORMAL
Neisseria Gonorrhea: NEGATIVE

## 2022-09-13 LAB — CULTURE, BETA STREP (GROUP B ONLY): Strep Gp B Culture: NEGATIVE

## 2022-09-15 ENCOUNTER — Encounter: Payer: Medicaid Other | Admitting: Family Medicine

## 2022-09-22 ENCOUNTER — Encounter: Payer: Self-pay | Admitting: *Deleted

## 2022-09-23 ENCOUNTER — Ambulatory Visit: Payer: Medicaid Other | Attending: Maternal & Fetal Medicine

## 2022-09-23 ENCOUNTER — Ambulatory Visit: Payer: Medicaid Other | Admitting: *Deleted

## 2022-09-23 VITALS — BP 118/73 | HR 79

## 2022-09-23 DIAGNOSIS — Z148 Genetic carrier of other disease: Secondary | ICD-10-CM

## 2022-09-23 DIAGNOSIS — O09299 Supervision of pregnancy with other poor reproductive or obstetric history, unspecified trimester: Secondary | ICD-10-CM | POA: Diagnosis not present

## 2022-09-23 DIAGNOSIS — O285 Abnormal chromosomal and genetic finding on antenatal screening of mother: Secondary | ICD-10-CM | POA: Diagnosis not present

## 2022-09-23 DIAGNOSIS — O0933 Supervision of pregnancy with insufficient antenatal care, third trimester: Secondary | ICD-10-CM

## 2022-09-23 DIAGNOSIS — O09293 Supervision of pregnancy with other poor reproductive or obstetric history, third trimester: Secondary | ICD-10-CM | POA: Diagnosis not present

## 2022-09-23 DIAGNOSIS — Z3A38 38 weeks gestation of pregnancy: Secondary | ICD-10-CM

## 2022-10-05 ENCOUNTER — Inpatient Hospital Stay (HOSPITAL_COMMUNITY)
Admission: AD | Admit: 2022-10-05 | Discharge: 2022-10-06 | DRG: 807 | Disposition: A | Discharge status: Home / Self Care | Payer: Medicaid Other | Attending: Obstetrics and Gynecology | Admitting: Obstetrics and Gynecology

## 2022-10-05 ENCOUNTER — Encounter (HOSPITAL_COMMUNITY): Payer: Self-pay | Admitting: Obstetrics and Gynecology

## 2022-10-05 DIAGNOSIS — O09823 Supervision of pregnancy with history of in utero procedure during previous pregnancy, third trimester: Secondary | ICD-10-CM | POA: Diagnosis not present

## 2022-10-05 DIAGNOSIS — O99824 Streptococcus B carrier state complicating childbirth: Secondary | ICD-10-CM | POA: Diagnosis present

## 2022-10-05 DIAGNOSIS — O480 Post-term pregnancy: Secondary | ICD-10-CM | POA: Diagnosis present

## 2022-10-05 DIAGNOSIS — Z3A40 40 weeks gestation of pregnancy: Secondary | ICD-10-CM

## 2022-10-05 DIAGNOSIS — Z3490 Encounter for supervision of normal pregnancy, unspecified, unspecified trimester: Secondary | ICD-10-CM

## 2022-10-05 DIAGNOSIS — O99820 Streptococcus B carrier state complicating pregnancy: Secondary | ICD-10-CM | POA: Diagnosis not present

## 2022-10-05 LAB — TYPE AND SCREEN
ABO/RH(D): B POS
Antibody Screen: NEGATIVE

## 2022-10-05 LAB — RPR: RPR Ser Ql: NONREACTIVE

## 2022-10-05 LAB — CBC
HCT: 36 % (ref 36.0–46.0)
Hemoglobin: 11.6 g/dL — ABNORMAL LOW (ref 12.0–15.0)
MCH: 25.4 pg — ABNORMAL LOW (ref 26.0–34.0)
MCHC: 32.2 g/dL (ref 30.0–36.0)
MCV: 78.8 fL — ABNORMAL LOW (ref 80.0–100.0)
Platelets: 287 10*3/uL (ref 150–400)
RBC: 4.57 MIL/uL (ref 3.87–5.11)
RDW: 14 % (ref 11.5–15.5)
WBC: 10 10*3/uL (ref 4.0–10.5)
nRBC: 0 % (ref 0.0–0.2)

## 2022-10-05 MED ORDER — ONDANSETRON HCL 4 MG/2ML IJ SOLN: 4.0000 mg | INTRAMUSCULAR | Status: DC | PRN

## 2022-10-05 MED ORDER — TETANUS-DIPHTH-ACELL PERTUSSIS 5-2.5-18.5 LF-MCG/0.5 IM SUSY: 0.5000 mL | PREFILLED_SYRINGE | Freq: Once | INTRAMUSCULAR | Status: DC

## 2022-10-05 MED ORDER — ONDANSETRON HCL 4 MG PO TABS: 4.0000 mg | ORAL_TABLET | ORAL | Status: DC | PRN

## 2022-10-05 MED ORDER — BENZOCAINE-MENTHOL 20-0.5 % EX AERO: 1.0000 | INHALATION_SPRAY | CUTANEOUS | Status: DC | PRN

## 2022-10-05 MED ORDER — OXYTOCIN BOLUS FROM INFUSION
333.0000 mL | Freq: Once | INTRAVENOUS | Status: AC
  Administered 2022-10-05: 333 mL via INTRAVENOUS

## 2022-10-05 MED ORDER — WITCH HAZEL-GLYCERIN EX PADS: 1.0000 | MEDICATED_PAD | CUTANEOUS | Status: DC | PRN

## 2022-10-05 MED ORDER — LACTATED RINGERS IV SOLN: INTRAVENOUS | Status: DC

## 2022-10-05 MED ORDER — METHYLERGONOVINE MALEATE 0.2 MG/ML IJ SOLN
0.2000 mg | Freq: Once | INTRAMUSCULAR | Status: AC
  Administered 2022-10-05: 0.2 mg via INTRAMUSCULAR

## 2022-10-05 MED ORDER — DIPHENHYDRAMINE HCL 25 MG PO CAPS: 25.0000 mg | ORAL_CAPSULE | Freq: Four times a day (QID) | ORAL | Status: DC | PRN

## 2022-10-05 MED ORDER — METHYLERGONOVINE MALEATE 0.2 MG/ML IJ SOLN
INTRAMUSCULAR | Status: AC
  Filled 2022-10-05: qty 1

## 2022-10-05 MED ORDER — ZOLPIDEM TARTRATE 5 MG PO TABS: 5.0000 mg | ORAL_TABLET | Freq: Every evening | ORAL | Status: DC | PRN

## 2022-10-05 MED ORDER — LIDOCAINE HCL (PF) 1 % IJ SOLN
30.0000 mL | INTRAMUSCULAR | Status: DC | PRN
  Administered 2022-10-05: 30 mL via SUBCUTANEOUS
  Filled 2022-10-05: qty 30

## 2022-10-05 MED ORDER — LACTATED RINGERS IV SOLN: 500.0000 mL | INTRAVENOUS | Status: DC | PRN

## 2022-10-05 MED ORDER — PRENATAL MULTIVITAMIN CH
1.0000 | ORAL_TABLET | Freq: Every day | ORAL | Status: DC
  Administered 2022-10-05 – 2022-10-06 (×2): 1 via ORAL
  Filled 2022-10-05 (×2): qty 1

## 2022-10-05 MED ORDER — ACETAMINOPHEN 325 MG PO TABS: 650.0000 mg | ORAL_TABLET | ORAL | Status: DC | PRN

## 2022-10-05 MED ORDER — SENNOSIDES-DOCUSATE SODIUM 8.6-50 MG PO TABS
2.0000 | ORAL_TABLET | ORAL | Status: DC
  Administered 2022-10-05: 2 via ORAL
  Filled 2022-10-05: qty 2

## 2022-10-05 MED ORDER — FENTANYL CITRATE (PF) 100 MCG/2ML IJ SOLN
100.0000 ug | Freq: Once | INTRAMUSCULAR | Status: AC
  Administered 2022-10-05: 100 ug via INTRAVENOUS

## 2022-10-05 MED ORDER — SOD CITRATE-CITRIC ACID 500-334 MG/5ML PO SOLN: 30.0000 mL | ORAL | Status: DC | PRN

## 2022-10-05 MED ORDER — SIMETHICONE 80 MG PO CHEW: 80.0000 mg | CHEWABLE_TABLET | ORAL | Status: DC | PRN

## 2022-10-05 MED ORDER — COCONUT OIL OIL
1.0000 | TOPICAL_OIL | Status: DC | PRN
  Administered 2022-10-05: 1 via TOPICAL

## 2022-10-05 MED ORDER — OXYTOCIN-SODIUM CHLORIDE 30-0.9 UT/500ML-% IV SOLN
INTRAVENOUS | Status: AC
  Filled 2022-10-05: qty 500

## 2022-10-05 MED ORDER — OXYTOCIN-SODIUM CHLORIDE 30-0.9 UT/500ML-% IV SOLN: 2.5000 [IU]/h | INTRAVENOUS | Status: DC

## 2022-10-05 MED ORDER — DIBUCAINE (PERIANAL) 1 % EX OINT: 1.0000 | TOPICAL_OINTMENT | CUTANEOUS | Status: DC | PRN

## 2022-10-05 MED ORDER — FENTANYL CITRATE (PF) 100 MCG/2ML IJ SOLN
INTRAMUSCULAR | Status: AC
  Filled 2022-10-05: qty 2

## 2022-10-05 MED ORDER — IBUPROFEN 600 MG PO TABS
600.0000 mg | ORAL_TABLET | Freq: Four times a day (QID) | ORAL | Status: DC
  Administered 2022-10-05 – 2022-10-06 (×4): 600 mg via ORAL
  Filled 2022-10-05 (×4): qty 1

## 2022-10-05 MED ORDER — ONDANSETRON HCL 4 MG/2ML IJ SOLN: 4.0000 mg | Freq: Four times a day (QID) | INTRAMUSCULAR | Status: DC | PRN

## 2022-10-05 NOTE — H&P (Signed)
LABOR AND DELIVERY ADMISSION HISTORY AND PHYSICAL NOTE  Stacey Knight is a 32 y.o. female G63P2002 with IUP at [redacted]w[redacted]d presenting for spontaneous labor. Water broke at 9 am this morning and contractions started shortly thereafter. No bleeding.   Prenatal History/Complications:  Past Medical History: Past Medical History:  Diagnosis Date   Language barrier affecting health care 11/26/2020   Needs swahili interpreter   Language barrier affecting health care 11/26/2020   Needs swahili interpreter   Medical history non-contributory     Past Surgical History: Past Surgical History:  Procedure Laterality Date   NO PAST SURGERIES      Obstetrical History: OB History     Gravida  3   Para  2   Term  2   Preterm      AB      Living  2      SAB      IAB      Ectopic      Multiple  0   Live Births  2           Social History: Social History   Socioeconomic History   Marital status: Unknown    Spouse name: Not on file   Number of children: Not on file   Years of education: Not on file   Highest education level: Not on file  Occupational History   Not on file  Tobacco Use   Smoking status: Never   Smokeless tobacco: Never  Vaping Use   Vaping Use: Never used  Substance and Sexual Activity   Alcohol use: Never   Drug use: Never   Sexual activity: Yes    Birth control/protection: None  Other Topics Concern   Not on file  Social History Narrative   Not on file   Social Determinants of Health   Financial Resource Strain: Not on file  Food Insecurity: No Food Insecurity (06/09/2022)   Hunger Vital Sign    Worried About Running Out of Food in the Last Year: Never true    Ran Out of Food in the Last Year: Never true  Transportation Needs: No Transportation Needs (06/09/2022)   PRAPARE - Administrator, Civil Service (Medical): No    Lack of Transportation (Non-Medical): No  Physical Activity: Not on file  Stress: Not on file  Social  Connections: Not on file    Family History: Family History  Problem Relation Age of Onset   Healthy Mother    Healthy Father    Diabetes Neg Hx    Hypertension Neg Hx     Allergies: No Known Allergies  Medications Prior to Admission  Medication Sig Dispense Refill Last Dose   Blood Pressure Monitoring (BLOOD PRESSURE KIT) DEVI 1 Device by Does not apply route as needed. 1 each 0    Misc. Devices (GOJJI WEIGHT SCALE) MISC 1 each by Does not apply route as needed. 1 each 0    Prenatal Vit-Fe Fumarate-FA (MULTIVITAMIN-PRENATAL) 27-0.8 MG TABS tablet Take 1 tablet by mouth daily at 12 noon.        Review of Systems   All systems reviewed and negative except as stated in HPI  Blood pressure 111/64, pulse 82, height 6\' 2"  (1.88 m), weight 75.8 kg, last menstrual period 12/27/2021, unknown if currently breastfeeding. General appearance: alert, cooperative, and appears stated age Lungs: clear to auscultation bilaterally Heart: regular rate and rhythm Abdomen: soft, non-tender; bowel sounds normal Extremities: No calf swelling or tenderness Presentation: cephalic Fetal  monitoring: 120s/mod/-a/-d Uterine activity: q  Dilation: 10 Effacement (%): 100 Station: 0 Exam by:: Jaaliyah Lucatero   Prenatal labs: ABO, Rh: --/--/PENDING (06/05 1022) Antibody: PENDING (06/05 1022) Rubella: 5.20 (01/25 1118) RPR: Non Reactive (04/10 1414)  HBsAg: Negative (01/25 1118)  HIV: Non Reactive (04/10 1414)  GBS: Negative/-- (05/10 1101)  2 hr Glucola:  wnl Genetic screening:  wnl  Anatomy US: wnl  Prenatal Transfer Tool  Maternal Diabetes: No Genetic Screening: Normal Maternal Ultrasounds/Referrals: Normal Fetal Ultrasounds or other Referrals:  None Maternal Substance Abuse:  No Significant Maternal Medications:  None Significant Maternal Lab Results: Group B Strep positive  Results for orders placed or performed during the hospital encounter of 10/05/22 (from the past 24 hour(s))  Type and  screen [facility]   Collection Time: 10/05/22 10:22 AM  Result Value Ref Range   ABO/RH(D) PENDING    Antibody Screen PENDING    Sample Expiration      10/08/2022,2359 Performed at [facility], [address] [place], [place] [address]     Patient Active Problem List   Diagnosis Date Noted   Term pregnancy 10/05/2022   Vaginal delivery 10/05/2022   History of shoulder dystocia in prior pregnancy, currently pregnant 06/09/2022   Late prenatal care 06/09/2022   Increased SMA risk 06/07/2022   History of uterine inversion in 2022 pregnancy after VAVD 01/30/2021   Supervision of low-risk pregnancy 11/26/2020    Assessment: Stacey Knight is a 32 y.o. G3P2002 at [redacted]w[redacted]d here for SVD, in advanced active labor. Anticipate delivery soon.  #Labor: expectant #Pain: declines #FWB: Cat 1 #ID:  Gbs neg #MOF: breast #MOC: unclear #Circ:  tbd  Anette Riedel B Stacey Knight 10/05/2022, 10:56 AM

## 2022-10-05 NOTE — Lactation Note (Signed)
This note was copied from a baby's chart. Lactation Consultation Note  Patient Name: Stacey Knight RUEAV'W Date: 10/05/2022 Age:33 hours Reason for consult: Initial assessment Parents didn't need interpreter. Baby laying in bed fussing. FOB stated the baby hadn't been long finished BF for 5 minutes. Explained that wasn't very long. Parents stated he stopped so they put him in his bed. FOB asked if LC could swaddled tighter. LC checked diaper and was wet, LC changed diaper. Asked mom if LC can put baby back to breast, mom stated sure. Baby had no interest in BF at this time.  Experienced BF mom has good everted nipples and colostrum. Mom is BF/formula feeding. Encouraged to BF first before formula feeding. Newborn feeding habits, STS, I&O, positioning, body alignment reviewed. Mom encouraged to feed baby 8-12 times/24 hours and with feeding cues.  Encouraged to call for assistance or questions.   Maternal Data Has patient been taught Hand Expression?: Yes Does the patient have breastfeeding experience prior to this delivery?: Yes How long did the patient breastfeed?: 1 yr each 2 and 33 yr old  Feeding    LATCH Score Latch: Too sleepy or reluctant, no latch achieved, no sucking elicited.  Audible Swallowing: None  Type of Nipple: Everted at rest and after stimulation  Comfort (Breast/Nipple): Soft / non-tender  Hold (Positioning): Assistance needed to correctly position infant at breast and maintain latch.  LATCH Score: 5   Lactation Tools Discussed/Used    Interventions Interventions: Breast feeding basics reviewed;Assisted with latch;Support pillows;Position options;Hand express;LC Services brochure;Breast compression  Discharge    Consult Status Consult Status: Follow-up Date: 10/06/22 Follow-up type: In-patient    Charyl Dancer 10/05/2022, 11:15 PM

## 2022-10-05 NOTE — MAU Note (Signed)
.  Stacey Knight is a 32 y.o. at [redacted]w[redacted]d here in MAU reporting: clear, watery LOF around 0900 this morning, she has continued to leak since.  Contractions every: 3-5 minutes Onset of ctx: Yesterday Pain score: Pain Assessment Pain Assessment: 0-10 Pain Score: 5  Pain Location: Abdomen Pain Descriptors / Indicators: Contraction Pain Frequency: Intermittent Pain Onset: On-going  ROM: Membranes Sac Identifier: Sac 1 Membrane Status: Possible ROM - for evaluation Amount: None  Vaginal Bleeding: Vaginal Bleeding Vag. Bleeding: None  Last SVE: N/A  Epidural: Not planning  Fetal Movement: Reports positive FM  FHT: Fetal Heart Rate Mode: External Baseline Rate (A): 118 bpm Multiple birth?: No  There were no vitals filed for this visit.     OB Office: Faculty GBS: Negative HSV: Unable to assess at this time Lab orders placed from triage: MAU Labor Eval

## 2022-10-06 LAB — CBC
HCT: 31.8 % — ABNORMAL LOW (ref 36.0–46.0)
Hemoglobin: 10 g/dL — ABNORMAL LOW (ref 12.0–15.0)
MCH: 25.3 pg — ABNORMAL LOW (ref 26.0–34.0)
MCHC: 31.4 g/dL (ref 30.0–36.0)
MCV: 80.3 fL (ref 80.0–100.0)
Platelets: 225 10*3/uL (ref 150–400)
RBC: 3.96 MIL/uL (ref 3.87–5.11)
RDW: 14.1 % (ref 11.5–15.5)
WBC: 11.1 10*3/uL — ABNORMAL HIGH (ref 4.0–10.5)
nRBC: 0 % (ref 0.0–0.2)

## 2022-10-06 MED ORDER — IBUPROFEN 600 MG PO TABS: 600.0000 mg | ORAL_TABLET | Freq: Four times a day (QID) | ORAL | 0 refills | Status: DC

## 2022-10-06 NOTE — Discharge Summary (Signed)
Postpartum Discharge Summary  Date of Service updated     Patient Name: Stacey Knight DOB: 07/11/1990 MRN: [id number]  Date of admission: 10/05/2022 Delivery date:10/05/2022  Delivering provider: Shonna Chock [place]  Date of discharge: 10/06/2022  Admitting diagnosis: Term pregnancy [Z34.90] Intrauterine pregnancy: [redacted]w[redacted]d     Secondary diagnosis:  Principal Problem:   Vaginal delivery Active Problems:   Term pregnancy  Additional problems: none    Discharge diagnosis: Term Pregnancy Delivered                                              Post partum procedures: none Augmentation: N/A Complications: None  Hospital course: Onset of Labor With Vaginal Delivery      32 y.o. yo G3P3003 at [redacted]w[redacted]d was admitted in Active Labor on 10/05/2022. Labor course was complicated by Rapid Labor  Membrane Rupture Time/Date: 9:00 AM ,10/05/2022   Delivery Method:Vaginal, Spontaneous  Episiotomy: None  Lacerations:  2nd degree;Perineal  Patient had a postpartum course complicated by nothing.  She is ambulating, tolerating a regular diet, passing flatus, and urinating well. Patient is discharged home in stable condition on 10/06/22.  Newborn Data: Birth date:10/05/2022  Birth time:10:27 AM  Gender:Female  Living status:Living  Apgars:9 ,9  Weight:3340 g   Magnesium Sulfate received: No BMZ received: No Rhophylac:N/A MMR:No T-DaP:Given prenatally Flu: No Transfusion:No  Physical exam  Vitals:   10/05/22 1338 10/05/22 1740 10/05/22 2159 10/06/22 0219  BP: 134/83 129/74 (!) 98/58 109/65  Pulse: 78 77 73 66  Resp: 18 16 16 18   Temp:  98 F (36.7 C) 98.3 F (36.8 C) 98.5 F (36.9 C)  TempSrc:  Oral Oral Oral  SpO2: 100% 100% 100% 100%  Weight:      Height:       General: alert, cooperative, and no distress Lochia: appropriate Uterine Fundus: firm Incision: N/A DVT Evaluation: No evidence of DVT seen on physical exam. Labs: Lab Results  Component Value Date   WBC 11.1 (H) 10/06/2022    HGB 10.0 (L) 10/06/2022   HCT 31.8 (L) 10/06/2022   MCV 80.3 10/06/2022   PLT 225 10/06/2022       No data to display         Edinburgh Score:     No data to display            After visit meds:  Allergies as of 10/06/2022   No Known Allergies      Medication List     TAKE these medications    Blood Pressure Kit Devi 1 Device by Does not apply route as needed.   Gojji Weight Scale Misc 1 each by Does not apply route as needed.   ibuprofen 600 MG tablet Commonly known as: ADVIL Take 1 tablet (600 mg total) by mouth every 6 (six) hours.   multivitamin-prenatal 27-0.8 MG Tabs tablet Take 1 tablet by mouth daily at 12 noon.         Discharge home in stable condition Infant Feeding: Breast Infant Disposition:home with mother Discharge instruction: per After Visit Summary and Postpartum booklet. Activity: Advance as tolerated. Pelvic rest for 6 weeks.  Diet: routine diet Anticipated Birth Control: Unsure Postpartum Appointment:4 weeks Additional Postpartum F/U:  none Future Appointments:No future appointments. Follow up Visit:  Follow-up Information     [facility]  [facility]. Schedule an appointment as soon as possible for a visit in 4 week(s).   Specialty: Obstetrics and Gynecology Contact information: [address] [place] [address] [phone]                    10/06/2022 Wynelle Bourgeois, CNM

## 2022-10-24 ENCOUNTER — Telehealth (HOSPITAL_COMMUNITY): Payer: Self-pay

## 2022-10-24 NOTE — Telephone Encounter (Signed)
10/24/2022 1902  Name: Jacquelene Kopecky MRN: [id number] DOB: 1991/02/08  Reason for Call:  Transition of Care Hospital Discharge Call  Contact Status: Patient Contact Status: Complete  Language assistant needed: Interpreter Mode: Interpreter Not Needed        Follow-Up Questions: Do You Have Any Concerns About Your Health As You Heal From Delivery?: No Do You Have Any Concerns About Your Infants Health?: No  Edinburgh Postnatal Depression Scale:  In the Past 7 Days: I have been able to laugh and see the funny side of things.: As much as I always could I have looked forward with enjoyment to things.: As much as I ever did I have blamed myself unnecessarily when things went wrong.: No, never I have been anxious or worried for no good reason.: No, not at all I have felt scared or panicky for no good reason.: No, not at all Things have been getting on top of me.: No, I have been coping as well as ever I have been so unhappy that I have had difficulty sleeping.: Not at all I have felt sad or miserable.: No, not at all I have been so unhappy that I have been crying.: No, never The thought of harming myself has occurred to me.: Never Inocente Salles Postnatal Depression Scale Total: 0  PHQ2-9 Depression Scale:     Discharge Follow-up: Edinburgh score requires follow up?: No Patient was advised of the following resources:: Support Group, Breastfeeding Support Group Did patient express any COVID concerns?: No  Post-discharge interventions: Reviewed Newborn Safe Sleep Practices  Signature Signe Colt

## 2022-12-07 DIAGNOSIS — Q741 Congenital malformation of knee: Secondary | ICD-10-CM | POA: Insufficient documentation

## 2023-02-09 IMAGING — DX DG CHEST 1V
1 series · 1 of 1 positions shown · non-contrast
Comparison: None

CLINICAL DATA: Positive TB test

EXAM:
CHEST  1 VIEW

[dg chest 1 view]
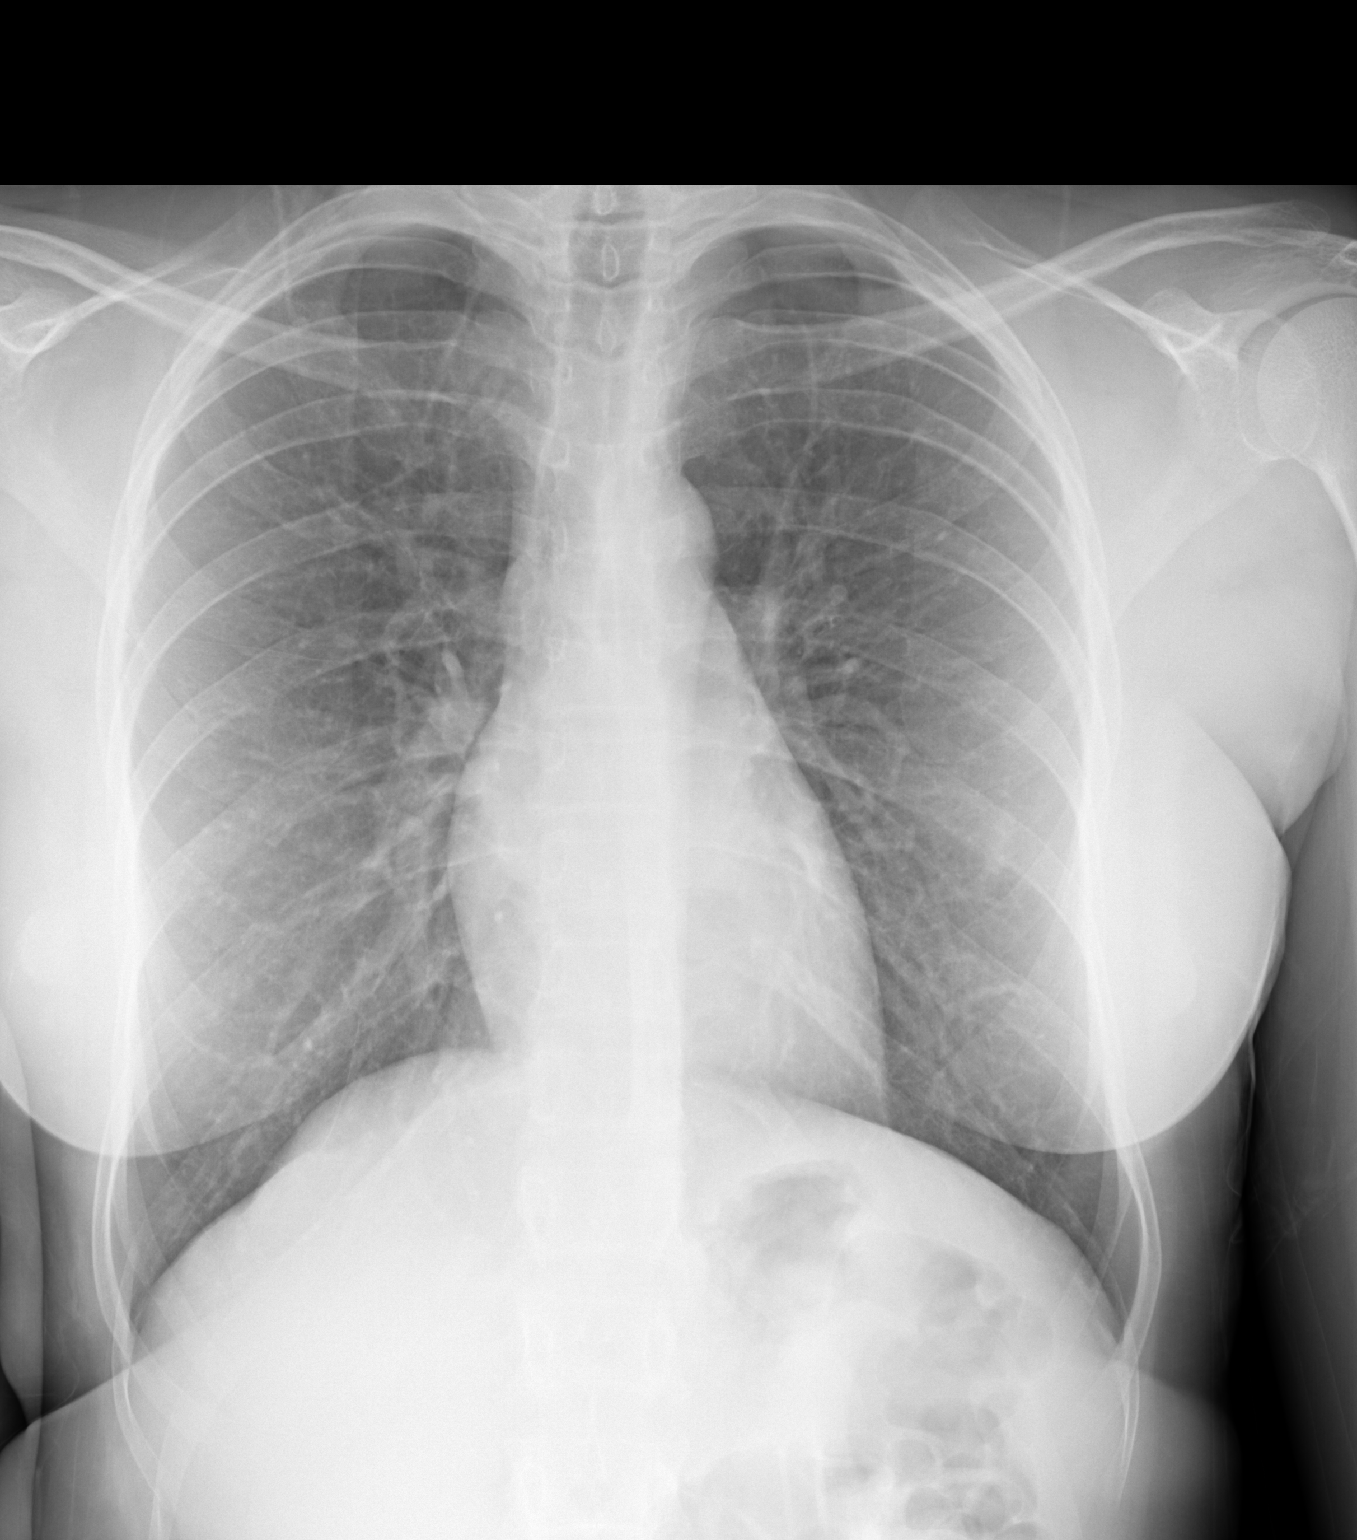

[1 of 1 positions shown; findings below may reference images not displayed]

FINDINGS: Normal heart size, mediastinal contours, and pulmonary vascularity.

Lungs clear.

No pleural effusion or pneumothorax.

Bones unremarkable.
IMPRESSION: Normal exam.

No radiographic evidence of TB.

## 2023-03-28 ENCOUNTER — Ambulatory Visit (HOSPITAL_BASED_OUTPATIENT_CLINIC_OR_DEPARTMENT_OTHER): Payer: Medicaid Other | Admitting: Family Medicine

## 2023-03-28 ENCOUNTER — Encounter (HOSPITAL_BASED_OUTPATIENT_CLINIC_OR_DEPARTMENT_OTHER): Payer: Self-pay | Admitting: Family Medicine

## 2023-03-28 DIAGNOSIS — M545 Low back pain, unspecified: Secondary | ICD-10-CM | POA: Diagnosis not present

## 2023-03-28 DIAGNOSIS — Z0000 Encounter for general adult medical examination without abnormal findings: Secondary | ICD-10-CM | POA: Diagnosis not present

## 2023-03-28 MED ORDER — MELOXICAM 7.5 MG PO TABS: 7.5000 mg | ORAL_TABLET | Freq: Every day | ORAL | 0 refills | Status: AC

## 2023-03-28 NOTE — Assessment & Plan Note (Signed)
Symptoms seem most consistent with musculoskeletal etiology of low back pain.  Patient did have questions about possible appendicitis, given symptoms, history, doubt appendicitis presently.  Also discussed potential kidney stone, however does not fit current clinical picture.  Recommend proceeding with conservative measures including oral medication, topical treatment, referral to physical therapy.  Discussed meloxicam, prescription sent to pharmacy on file.  Will plan to monitor progress at next appointment in about 2 to 3 months.

## 2023-03-28 NOTE — Progress Notes (Signed)
New Patient Office Visit  Subjective    Patient ID: Stacey Knight, female    DOB: 1990-11-12  Age: 32 y.o. MRN: [id number]  CC:  Chief Complaint  Patient presents with   Back Pain    Right side of back hurts for 1 week now.    HPI Stacey Knight presents to establish care Last PCP - none  Back pain: about 1 week, right side. Does not recall injury. No prior back issues. No urinary symptoms, no dysuria, no hematuria. No medications tried. Worse with bending forward, certain sleeping positions. No radiation of symptoms.  Patient moved here in 2022. Patient not working currently. She enjoys spending time with family, cooking.  Outpatient Encounter Medications as of 03/28/2023  Medication Sig   meloxicam (MOBIC) 7.5 MG tablet Take 1 tablet (7.5 mg total) by mouth daily.   [DISCONTINUED] Blood Pressure Monitoring (BLOOD PRESSURE KIT) DEVI 1 Device by Does not apply route as needed.   [DISCONTINUED] ibuprofen (ADVIL) 600 MG tablet Take 1 tablet (600 mg total) by mouth every 6 (six) hours. (Patient not taking: Reported on 03/28/2023)   [DISCONTINUED] Misc. Devices (GOJJI WEIGHT SCALE) MISC 1 each by Does not apply route as needed. (Patient not taking: Reported on 03/28/2023)   [DISCONTINUED] Prenatal Vit-Fe Fumarate-FA (MULTIVITAMIN-PRENATAL) 27-0.8 MG TABS tablet Take 1 tablet by mouth daily at 12 noon. (Patient not taking: Reported on 03/28/2023)   No facility-administered encounter medications on file as of 03/28/2023.    Past Medical History:  Diagnosis Date   Language barrier affecting health care 11/26/2020   Needs swahili interpreter   Language barrier affecting health care 11/26/2020   Needs swahili interpreter   Medical history non-contributory     Past Surgical History:  Procedure Laterality Date   NO PAST SURGERIES      Family History  Problem Relation Age of Onset   Healthy Mother    Healthy Father    Diabetes Neg Hx    Hypertension Neg Hx     Social History    Socioeconomic History   Marital status: Unknown    Spouse name: Not on file   Number of children: Not on file   Years of education: Not on file   Highest education level: Not on file  Occupational History   Not on file  Tobacco Use   Smoking status: Never    Passive exposure: Never   Smokeless tobacco: Never  Vaping Use   Vaping status: Never Used  Substance and Sexual Activity   Alcohol use: Never   Drug use: Never   Sexual activity: Yes    Birth control/protection: None  Other Topics Concern   Not on file  Social History Narrative   Not on file   Social Determinants of Health   Financial Resource Strain: Not on file  Food Insecurity: No Food Insecurity (06/09/2022)   Hunger Vital Sign    Worried About Running Out of Food in the Last Year: Never true    Ran Out of Food in the Last Year: Never true  Transportation Needs: No Transportation Needs (06/09/2022)   PRAPARE - Administrator, Civil Service (Medical): No    Lack of Transportation (Non-Medical): No  Physical Activity: Not on file  Stress: Not on file  Social Connections: Not on file  Intimate Partner Violence: Not on file    Objective    BP 108/82 (BP Location: Left Arm, Patient Position: Sitting, Cuff Size: Normal)   Pulse 85  Ht 6' (1.829 m)   Wt 179 lb 3.2 oz (81.3 kg)   SpO2 100%   BMI 24.30 kg/m   Physical Exam  32 year old female in no acute distress Cardiovascular exam with regular rate and rhythm, no murmur appreciated Lungs clear to auscultation bilaterally Lumbar spine: Visual inspection without obvious abnormality No tenderness to palpation over spinous processes, mild tenderness palpation through paraspinal musculature on right, no tenderness palpation through left side of low back Normal forward flexion, no pain elicited.  Normal back extension, no pain elicited.  Assessment & Plan:   Wellness examination -     CBC with Differential/Platelet; Future -     Comprehensive  metabolic panel; Future -     Hemoglobin A1c; Future -     Lipid panel; Future -     TSH Rfx on Abnormal to Free T4; Future  Acute right-sided low back pain without sciatica Assessment & Plan: Symptoms seem most consistent with musculoskeletal etiology of low back pain.  Patient did have questions about possible appendicitis, given symptoms, history, doubt appendicitis presently.  Also discussed potential kidney stone, however does not fit current clinical picture.  Recommend proceeding with conservative measures including oral medication, topical treatment, referral to physical therapy.  Discussed meloxicam, prescription sent to pharmacy on file.  Will plan to monitor progress at next appointment in about 2 to 3 months.  Orders: -     Ambulatory referral to Physical Therapy  Other orders -     Meloxicam; Take 1 tablet (7.5 mg total) by mouth daily.  Dispense: 30 tablet; Refill: 0  Return in about 3 months (around 06/28/2023) for CPE with fasting labs 1 week prior.    ___________________________________________ Emerald [place]or [place], MD, ABFM, CAQSM Primary Care and Sports Medicine [facility]

## 2023-05-08 ENCOUNTER — Ambulatory Visit (HOSPITAL_BASED_OUTPATIENT_CLINIC_OR_DEPARTMENT_OTHER): Payer: Medicaid Other | Admitting: Physical Therapy

## 2023-05-09 ENCOUNTER — Ambulatory Visit (HOSPITAL_BASED_OUTPATIENT_CLINIC_OR_DEPARTMENT_OTHER): Payer: Medicaid Other | Attending: Family Medicine | Admitting: Physical Therapy

## 2023-06-21 ENCOUNTER — Other Ambulatory Visit (HOSPITAL_BASED_OUTPATIENT_CLINIC_OR_DEPARTMENT_OTHER): Payer: Medicaid Other

## 2023-06-28 ENCOUNTER — Encounter (HOSPITAL_BASED_OUTPATIENT_CLINIC_OR_DEPARTMENT_OTHER): Payer: Self-pay | Admitting: Family Medicine

## 2023-06-28 ENCOUNTER — Ambulatory Visit (INDEPENDENT_AMBULATORY_CARE_PROVIDER_SITE_OTHER): Payer: Medicaid Other | Admitting: Family Medicine

## 2023-06-28 VITALS — BP 115/76 | HR 76 | Ht 72.0 in | Wt 175.9 lb

## 2023-06-28 DIAGNOSIS — M545 Low back pain, unspecified: Secondary | ICD-10-CM | POA: Diagnosis not present

## 2023-06-28 DIAGNOSIS — Z0000 Encounter for general adult medical examination without abnormal findings: Secondary | ICD-10-CM | POA: Insufficient documentation

## 2023-06-28 NOTE — Progress Notes (Signed)
 Subjective:    CC: Annual Physical Exam  HPI:  Stacey Knight is a 33 y.o. presenting for annual physical  I reviewed the past medical history, family history, social history, surgical history, and allergies today and no changes were needed.  Please see the problem list section below in epic for further details.  Past Medical History: Past Medical History:  Diagnosis Date   Language barrier affecting health care 11/26/2020   Needs swahili interpreter   Language barrier affecting health care 11/26/2020   Needs swahili interpreter   Medical history non-contributory    Past Surgical History: Past Surgical History:  Procedure Laterality Date   NO PAST SURGERIES     Social History: Social History   Socioeconomic History   Marital status: Unknown    Spouse name: Not on file   Number of children: Not on file   Years of education: Not on file   Highest education level: Not on file  Occupational History   Not on file  Tobacco Use   Smoking status: Never    Passive exposure: Never   Smokeless tobacco: Never  Vaping Use   Vaping status: Never Used  Substance and Sexual Activity   Alcohol use: Never   Drug use: Never   Sexual activity: Yes    Birth control/protection: None  Other Topics Concern   Not on file  Social History Narrative   Not on file   Social Drivers of Health   Financial Resource Strain: Not on file  Food Insecurity: No Food Insecurity (06/09/2022)   Hunger Vital Sign    Worried About Running Out of Food in the Last Year: Never true    Ran Out of Food in the Last Year: Never true  Transportation Needs: No Transportation Needs (06/09/2022)   PRAPARE - Administrator, Civil Service (Medical): No    Lack of Transportation (Non-Medical): No  Physical Activity: Not on file  Stress: Not on file  Social Connections: Not on file   Family History: Family History  Problem Relation Age of Onset   Healthy Mother    Healthy Father    Diabetes Neg Hx     Hypertension Neg Hx    Allergies: No Known Allergies Medications: See med rec.  Review of Systems: No headache, visual changes, nausea, vomiting, diarrhea, constipation, dizziness, abdominal pain, skin rash, fevers, chills, night sweats, swollen lymph nodes, weight loss, chest pain, body aches, joint swelling, muscle aches, shortness of breath, mood changes, visual or auditory hallucinations.  Objective:    BP 115/76 (BP Location: Left Arm, Patient Position: Sitting, Cuff Size: Normal)   Pulse 76   Ht 6' (1.829 m)   Wt 175 lb 14.4 oz (79.8 kg)   SpO2 100%   BMI 23.86 kg/m   General: Well Developed, well nourished, and in no acute distress. Neuro: Alert and oriented x3, extra-ocular muscles intact, sensation grossly intact. Cranial nerves II through XII are intact, motor, sensory, and coordinative functions are all intact. HEENT: Normocephalic, atraumatic, pupils equal round reactive to light, neck supple, no masses, no lymphadenopathy, thyroid nonpalpable. Oropharynx, nasopharynx, external ear canals are unremarkable. Skin: Warm and dry, no rashes noted. Cardiac: Regular rate and rhythm, no murmurs rubs or gallops. Respiratory: Clear to auscultation bilaterally. Not using accessory muscles, speaking in full sentences. Abdominal: Soft, nontender, nondistended, positive bowel sounds, no masses, no organomegaly. Musculoskeletal: Shoulder, elbow, wrist, hip, knee, ankle stable, and with full range of motion.  Impression and Recommendations:    Wellness  examination Assessment & Plan: Routine HCM labs ordered. HCM reviewed/discussed. Anticipatory guidance regarding healthy weight, lifestyle and choices given. Recommend healthy diet.  Recommend approximately 150 minutes/week of moderate intensity exercise Recommend regular dental and vision exams Always use seatbelt/lap and shoulder restraints Recommend using smoke alarms and checking batteries at least twice a year Recommend using  sunscreen when outside Discussed tetanus immunization recommendations, patient UTD  Orders: -     TSH Rfx on Abnormal to Free T4 -     Lipid panel -     Hemoglobin A1c -     Comprehensive metabolic panel -     CBC with Differential/Platelet  Acute right-sided low back pain without sciatica Assessment & Plan: Did have appointment scheduled with PT, however did not show for scheduled appointment.  Again reviewed recommendation to have further evaluation and treatment with PT.  Patient amenable, new referral placed and contact information provided to patient today  Orders: -     Ambulatory referral to Physical Therapy  Return in about 4 months (around 10/26/2023) for back pain.   ___________________________________________ Markela Wee de [place], MD, ABFM, [facility]

## 2023-06-28 NOTE — Patient Instructions (Signed)
  Medication Instructions:  Your physician recommends that you continue on your current medications as directed. Please refer to the Current Medication list given to you today. --If you need a refill on any your medications before your next appointment, please call your pharmacy first. If no refills are authorized on file call the office.-- Lab Work: Your physician has recommended that you have lab work today: today If you have labs (blood work) drawn today and your tests are completely normal, you will receive your results via MyChart message OR a phone call from our staff.  Please ensure you check your voicemail in the event that you authorized detailed messages to be left on a delegated number. If you have any lab test that is abnormal or we need to change your treatment, we will call you to review the results.  Referrals/Procedures/Imaging: Physical therapy   Follow-Up: Your next appointment:   Your physician recommends that you schedule a follow-up appointment in: 3-4 months follow up  with Dr. de [place]  You will receive a text message or e-mail with a link to a survey about your care and experience with [place] today! We would greatly appreciate your feedback!   Thanks for letting us be apart of your health journey!!  Primary Care and Sports Medicine   Dr. Ceasar Mons [place]   We encourage you to activate your patient portal called "MyChart".  Sign up information is provided on this After Visit Summary.  MyChart is used to connect with patients for Virtual Visits (Telemedicine).  Patients are able to view lab/test results, encounter notes, upcoming appointments, etc.  Non-urgent messages can be sent to your provider as well. To learn more about what you can do with MyChart, please visit --  [email or web address].

## 2023-06-28 NOTE — Assessment & Plan Note (Addendum)
 Routine HCM labs ordered. HCM reviewed/discussed. Anticipatory guidance regarding healthy weight, lifestyle and choices given. Recommend healthy diet.  Recommend approximately 150 minutes/week of moderate intensity exercise Recommend regular dental and vision exams Always use seatbelt/lap and shoulder restraints Recommend using smoke alarms and checking batteries at least twice a year Recommend using sunscreen when outside Discussed tetanus immunization recommendations, patient UTD

## 2023-06-28 NOTE — Assessment & Plan Note (Signed)
 Did have appointment scheduled with PT, however did not show for scheduled appointment.  Again reviewed recommendation to have further evaluation and treatment with PT.  Patient amenable, new referral placed and contact information provided to patient today

## 2023-06-29 LAB — COMPREHENSIVE METABOLIC PANEL
ALT: 8 IU/L (ref 0–32)
AST: 15 IU/L (ref 0–40)
Albumin: 4.3 g/dL (ref 3.9–4.9)
Alkaline Phosphatase: 158 IU/L — ABNORMAL HIGH (ref 44–121)
BUN/Creatinine Ratio: 22 (ref 9–23)
BUN: 14 mg/dL (ref 6–20)
Bilirubin Total: 0.2 mg/dL (ref 0.0–1.2)
CO2: 22 mmol/L (ref 20–29)
Calcium: 8.9 mg/dL (ref 8.7–10.2)
Chloride: 107 mmol/L — ABNORMAL HIGH (ref 96–106)
Creatinine, Ser: 0.64 mg/dL (ref 0.57–1.00)
Globulin, Total: 2.8 g/dL (ref 1.5–4.5)
Glucose: 91 mg/dL (ref 70–99)
Potassium: 3.8 mmol/L (ref 3.5–5.2)
Sodium: 143 mmol/L (ref 134–144)
Total Protein: 7.1 g/dL (ref 6.0–8.5)
eGFR: 119 mL/min/{1.73_m2} (ref >59)

## 2023-06-29 LAB — CBC WITH DIFFERENTIAL/PLATELET
Basophils Absolute: 0.1 10*3/uL (ref 0.0–0.2)
Basos: 1 %
EOS (ABSOLUTE): 0.3 10*3/uL (ref 0.0–0.4)
Eos: 4 %
Hematocrit: 37.1 % (ref 34.0–46.6)
Hemoglobin: 11.8 g/dL (ref 11.1–15.9)
Immature Grans (Abs): 0 10*3/uL (ref 0.0–0.1)
Immature Granulocytes: 0 %
Lymphocytes Absolute: 2.7 10*3/uL (ref 0.7–3.1)
Lymphs: 38 %
MCH: 26.3 pg — ABNORMAL LOW (ref 26.6–33.0)
MCHC: 31.8 g/dL (ref 31.5–35.7)
MCV: 83 fL (ref 79–97)
Monocytes Absolute: 0.6 10*3/uL (ref 0.1–0.9)
Monocytes: 8 %
Neutrophils Absolute: 3.5 10*3/uL (ref 1.4–7.0)
Neutrophils: 49 %
Platelets: 263 10*3/uL (ref 150–450)
RBC: 4.49 x10E6/uL (ref 3.77–5.28)
RDW: 14.6 % (ref 11.7–15.4)
WBC: 7.2 10*3/uL (ref 3.4–10.8)

## 2023-06-29 LAB — HEMOGLOBIN A1C
Est. average glucose Bld gHb Est-mCnc: 120 mg/dL
Hgb A1c MFr Bld: 5.8 % — ABNORMAL HIGH (ref 4.8–5.6)

## 2023-06-29 LAB — LIPID PANEL
Chol/HDL Ratio: 2.3 ratio (ref 0.0–4.4)
Cholesterol, Total: 154 mg/dL (ref 100–199)
HDL: 67 mg/dL (ref >39)
LDL Chol Calc (NIH): 78 mg/dL (ref 0–99)
Triglycerides: 39 mg/dL (ref 0–149)
VLDL Cholesterol Cal: 9 mg/dL (ref 5–40)

## 2023-06-29 LAB — TSH RFX ON ABNORMAL TO FREE T4: TSH: 1.88 u[IU]/mL (ref 0.450–4.500)
